# Patient Record
Sex: Female | Born: 1980
Health system: Southern US, Community
[De-identification: ages and names within clinical notes are randomized; demographics above are authoritative.]

## PROBLEM LIST (undated history)

## (undated) DIAGNOSIS — F070 Personality change due to known physiological condition: Secondary | ICD-10-CM

## (undated) DIAGNOSIS — S069XAA Unspecified intracranial injury with loss of consciousness status unknown, initial encounter: Secondary | ICD-10-CM

## (undated) DIAGNOSIS — S069X9A Unspecified intracranial injury with loss of consciousness of unspecified duration, initial encounter: Secondary | ICD-10-CM

## (undated) DIAGNOSIS — G8110 Spastic hemiplegia affecting unspecified side: Secondary | ICD-10-CM

## (undated) HISTORY — DX: Unspecified intracranial injury with loss of consciousness status unknown, initial encounter: S06.9XAA

## (undated) HISTORY — DX: Unspecified intracranial injury with loss of consciousness of unspecified duration, initial encounter: S06.9X9A

## (undated) HISTORY — DX: Personality change due to known physiological condition: F07.0

## (undated) HISTORY — DX: Spastic hemiplegia affecting unspecified side: G81.10

---
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
## 1999-12-25 ENCOUNTER — Emergency Department (HOSPITAL_COMMUNITY): Admission: EM | Admit: 1999-12-25 | Discharge: 1999-12-25 | Payer: Self-pay

## 2000-01-04 ENCOUNTER — Emergency Department (HOSPITAL_COMMUNITY): Admission: EM | Admit: 2000-01-04 | Discharge: 2000-01-04 | Payer: Self-pay | Admitting: Emergency Medicine

## 2000-08-12 ENCOUNTER — Other Ambulatory Visit: Admission: RE | Admit: 2000-08-12 | Discharge: 2000-08-12 | Payer: Self-pay | Admitting: Gynecology

## 2001-05-26 ENCOUNTER — Encounter: Payer: Self-pay | Admitting: Emergency Medicine

## 2001-05-26 ENCOUNTER — Emergency Department (HOSPITAL_COMMUNITY): Admission: EM | Admit: 2001-05-26 | Discharge: 2001-05-27 | Payer: Self-pay | Admitting: Physical Therapy

## 2006-02-21 ENCOUNTER — Other Ambulatory Visit: Admission: RE | Admit: 2006-02-21 | Discharge: 2006-02-21 | Payer: Self-pay | Admitting: Gynecology

## 2006-03-04 ENCOUNTER — Ambulatory Visit: Payer: Self-pay | Admitting: Internal Medicine

## 2008-01-23 ENCOUNTER — Inpatient Hospital Stay (HOSPITAL_COMMUNITY): Admission: AD | Admit: 2008-01-23 | Discharge: 2008-01-23 | Payer: Self-pay | Admitting: Obstetrics and Gynecology

## 2008-02-05 ENCOUNTER — Ambulatory Visit: Payer: Self-pay | Admitting: *Deleted

## 2008-02-05 ENCOUNTER — Inpatient Hospital Stay (HOSPITAL_COMMUNITY): Admission: AD | Admit: 2008-02-05 | Discharge: 2008-02-05 | Payer: Self-pay | Admitting: Obstetrics & Gynecology

## 2008-03-17 ENCOUNTER — Encounter: Admission: RE | Admit: 2008-03-17 | Discharge: 2008-03-17 | Payer: Self-pay | Admitting: Family Medicine

## 2008-05-09 ENCOUNTER — Inpatient Hospital Stay (HOSPITAL_COMMUNITY): Admission: AD | Admit: 2008-05-09 | Discharge: 2008-05-09 | Payer: Self-pay | Admitting: Gynecology

## 2008-05-09 ENCOUNTER — Ambulatory Visit: Payer: Self-pay | Admitting: Gynecology

## 2008-06-08 ENCOUNTER — Ambulatory Visit (HOSPITAL_COMMUNITY): Admission: RE | Admit: 2008-06-08 | Discharge: 2008-06-08 | Payer: Self-pay | Admitting: Obstetrics & Gynecology

## 2008-06-20 ENCOUNTER — Ambulatory Visit: Payer: Self-pay | Admitting: Obstetrics and Gynecology

## 2008-07-14 ENCOUNTER — Encounter: Admission: RE | Admit: 2008-07-14 | Discharge: 2008-07-14 | Payer: Self-pay | Admitting: Family Medicine

## 2009-02-23 ENCOUNTER — Encounter: Admission: RE | Admit: 2009-02-23 | Discharge: 2009-02-23 | Payer: Self-pay | Admitting: Family Medicine

## 2009-06-06 ENCOUNTER — Inpatient Hospital Stay (HOSPITAL_COMMUNITY): Admission: AD | Admit: 2009-06-06 | Discharge: 2009-06-06 | Payer: Self-pay | Admitting: Obstetrics & Gynecology

## 2009-11-05 HISTORY — PX: OTHER SURGICAL HISTORY: SHX169

## 2010-05-31 ENCOUNTER — Inpatient Hospital Stay (HOSPITAL_COMMUNITY): Admission: AD | Admit: 2010-05-31 | Discharge: 2010-05-31 | Payer: Self-pay | Admitting: Obstetrics & Gynecology

## 2010-05-31 ENCOUNTER — Ambulatory Visit: Payer: Self-pay | Admitting: Nurse Practitioner

## 2010-06-02 ENCOUNTER — Ambulatory Visit: Payer: Self-pay | Admitting: Obstetrics and Gynecology

## 2010-06-02 ENCOUNTER — Ambulatory Visit (HOSPITAL_COMMUNITY): Admission: RE | Admit: 2010-06-02 | Discharge: 2010-06-02 | Payer: Self-pay | Admitting: Obstetrics & Gynecology

## 2010-06-09 ENCOUNTER — Ambulatory Visit: Payer: Self-pay | Admitting: Family

## 2010-06-09 ENCOUNTER — Ambulatory Visit (HOSPITAL_COMMUNITY): Admission: RE | Admit: 2010-06-09 | Discharge: 2010-06-09 | Payer: Self-pay | Admitting: Obstetrics & Gynecology

## 2010-06-13 ENCOUNTER — Ambulatory Visit: Payer: Self-pay | Admitting: Obstetrics and Gynecology

## 2010-06-13 ENCOUNTER — Inpatient Hospital Stay (HOSPITAL_COMMUNITY): Admission: AD | Admit: 2010-06-13 | Discharge: 2010-06-13 | Payer: Self-pay | Admitting: Obstetrics and Gynecology

## 2010-06-15 ENCOUNTER — Ambulatory Visit (HOSPITAL_COMMUNITY): Admission: RE | Admit: 2010-06-15 | Discharge: 2010-06-15 | Payer: Self-pay | Admitting: Obstetrics and Gynecology

## 2010-06-15 ENCOUNTER — Ambulatory Visit: Payer: Self-pay | Admitting: Obstetrics and Gynecology

## 2010-06-22 ENCOUNTER — Ambulatory Visit: Payer: Self-pay | Admitting: Gynecology

## 2010-06-22 ENCOUNTER — Inpatient Hospital Stay (HOSPITAL_COMMUNITY)
Admission: AD | Admit: 2010-06-22 | Discharge: 2010-06-22 | Payer: Self-pay | Source: Home / Self Care | Admitting: Family Medicine

## 2010-10-27 ENCOUNTER — Inpatient Hospital Stay (HOSPITAL_COMMUNITY)
Admission: AC | Admit: 2010-10-27 | Discharge: 2010-12-05 | Disposition: A | Discharge status: Rehabilitation Facility | Payer: No Typology Code available for payment source | Source: Home / Self Care

## 2010-11-27 LAB — BASIC METABOLIC PANEL
BUN: 16 mg/dL (ref 6–23)
CO2: 28 mEq/L (ref 19–32)
CO2: 29 mEq/L (ref 19–32)
Calcium: 8.9 mg/dL (ref 8.4–10.5)
Chloride: 106 mEq/L (ref 96–112)
GFR calc Af Amer: >60 mL/min (ref >60)
Glucose, Bld: 126 mg/dL — ABNORMAL HIGH (ref 70–99)
Potassium: 4.1 mEq/L (ref 3.5–5.1)
Potassium: 3.9 mEq/L (ref 3.5–5.1)
Sodium: 141 mEq/L (ref 135–145)

## 2010-11-27 LAB — GLUCOSE, CAPILLARY
Glucose-Capillary: 124 mg/dL — ABNORMAL HIGH (ref 70–99)
Glucose-Capillary: 140 mg/dL — ABNORMAL HIGH (ref 70–99)
Glucose-Capillary: 140 mg/dL — ABNORMAL HIGH (ref 70–99)
Glucose-Capillary: 145 mg/dL — ABNORMAL HIGH (ref 70–99)
Glucose-Capillary: 161 mg/dL — ABNORMAL HIGH (ref 70–99)
Glucose-Capillary: 181 mg/dL — ABNORMAL HIGH (ref 70–99)
Glucose-Capillary: 105 mg/dL — ABNORMAL HIGH (ref 70–99)
Glucose-Capillary: 119 mg/dL — ABNORMAL HIGH (ref 70–99)
Glucose-Capillary: 119 mg/dL — ABNORMAL HIGH (ref 70–99)
Glucose-Capillary: 126 mg/dL — ABNORMAL HIGH (ref 70–99)
Glucose-Capillary: 151 mg/dL — ABNORMAL HIGH (ref 70–99)

## 2010-11-27 LAB — URINALYSIS, ROUTINE W REFLEX MICROSCOPIC
Nitrite: POSITIVE — AB
Specific Gravity, Urine: 1.029 (ref 1.005–1.030)
pH: 6 (ref 5.0–8.0)

## 2010-11-27 LAB — URINE MICROSCOPIC-ADD ON

## 2010-11-27 LAB — CBC
Hemoglobin: 10.8 g/dL — ABNORMAL LOW (ref 12.0–15.0)
MCV: 95.3 fL (ref 78.0–100.0)
Platelets: 485 10*3/uL — ABNORMAL HIGH (ref 150–400)
RBC: 3.8 MIL/uL — ABNORMAL LOW (ref 3.87–5.11)
WBC: 13.9 10*3/uL — ABNORMAL HIGH (ref 4.0–10.5)

## 2010-11-28 LAB — DIFFERENTIAL
Basophils Relative: 1 % (ref 0–1)
Lymphocytes Relative: 22 % (ref 12–46)
Lymphs Abs: 2.4 10*3/uL (ref 0.7–4.0)
Monocytes Relative: 9 % (ref 3–12)
Neutro Abs: 6.7 10*3/uL (ref 1.7–7.7)
Neutrophils Relative %: 64 % (ref 43–77)

## 2010-11-28 LAB — BASIC METABOLIC PANEL
CO2: 29 mEq/L (ref 19–32)
Calcium: 8.8 mg/dL (ref 8.4–10.5)
Creatinine, Ser: 0.46 mg/dL (ref 0.4–1.2)
GFR calc Af Amer: >60 mL/min (ref >60)
GFR calc non Af Amer: >60 mL/min (ref >60)
Sodium: 142 mEq/L (ref 135–145)

## 2010-11-28 LAB — GLUCOSE, CAPILLARY
Glucose-Capillary: 120 mg/dL — ABNORMAL HIGH (ref 70–99)
Glucose-Capillary: 120 mg/dL — ABNORMAL HIGH (ref 70–99)
Glucose-Capillary: 133 mg/dL — ABNORMAL HIGH (ref 70–99)
Glucose-Capillary: 139 mg/dL — ABNORMAL HIGH (ref 70–99)
Glucose-Capillary: 149 mg/dL — ABNORMAL HIGH (ref 70–99)
Glucose-Capillary: 128 mg/dL — ABNORMAL HIGH (ref 70–99)
Glucose-Capillary: 137 mg/dL — ABNORMAL HIGH (ref 70–99)
Glucose-Capillary: 143 mg/dL — ABNORMAL HIGH (ref 70–99)
Glucose-Capillary: 157 mg/dL — ABNORMAL HIGH (ref 70–99)
Glucose-Capillary: 91 mg/dL (ref 70–99)

## 2010-11-28 LAB — CBC
Hemoglobin: 10.7 g/dL — ABNORMAL LOW (ref 12.0–15.0)
MCH: 28.5 pg (ref 26.0–34.0)
MCV: 94.1 fL (ref 78.0–100.0)
RBC: 3.75 MIL/uL — ABNORMAL LOW (ref 3.87–5.11)

## 2010-11-29 ENCOUNTER — Encounter: Payer: Self-pay | Admitting: Family Medicine

## 2010-11-29 LAB — GLUCOSE, CAPILLARY
Glucose-Capillary: 123 mg/dL — ABNORMAL HIGH (ref 70–99)
Glucose-Capillary: 135 mg/dL — ABNORMAL HIGH (ref 70–99)
Glucose-Capillary: 143 mg/dL — ABNORMAL HIGH (ref 70–99)
Glucose-Capillary: 153 mg/dL — ABNORMAL HIGH (ref 70–99)
Glucose-Capillary: 118 mg/dL — ABNORMAL HIGH (ref 70–99)

## 2010-11-29 LAB — CATH TIP CULTURE

## 2010-11-30 LAB — GLUCOSE, CAPILLARY
Glucose-Capillary: 142 mg/dL — ABNORMAL HIGH (ref 70–99)
Glucose-Capillary: 133 mg/dL — ABNORMAL HIGH (ref 70–99)

## 2010-11-30 LAB — CULTURE, BLOOD (ROUTINE X 2): Culture  Setup Time: 201201202209

## 2010-12-01 LAB — GLUCOSE, CAPILLARY
Glucose-Capillary: 138 mg/dL — ABNORMAL HIGH (ref 70–99)
Glucose-Capillary: 118 mg/dL — ABNORMAL HIGH (ref 70–99)

## 2010-12-01 NOTE — Op Note (Signed)
[name], [name]               ACCOUNT NO.:  [id number]  MEDICAL RECORD NO.:  [id number]          PATIENT TYPE:  INP  LOCATION:  3108                         FACILITY:  [facility]  PHYSICIAN:  Cherylynn Ridges, M.D.    DATE OF BIRTH:  Jul 21, 1981  DATE OF PROCEDURE:  11/09/2010 DATE OF DISCHARGE:                              OPERATIVE REPORT   PREOPERATIVE DIAGNOSIS:  Severe closed head injury requiring prolonged ventilatory nutritional support.  POSTOP DIAGNOSIS:  Severe closed head injury requiring prolonged ventilatory nutritional support.  PROCEDURES: 1. Percutaneous endoscopic gastrostomy tube. 2. Percutaneous tracheostomy tube with bronchoscopy.  SURGEON:  Marta Lamas. Lindie Spruce, MD  ASSISTANT:  Amber L. Freida Busman, MD  ANESTHESIA:  She was given a total of 200 mcg of IV fentanyl, 8 mg of IV Versed, and 20 mg of IV Norcuron.  COMPLICATIONS:  None.  CONDITION:  Stable.  FINDINGS:  Normal anatomy.  ICP did rise up to about 40 during the procedure.  She was maintained in the semiupright position.  No less than 20 degrees for the procedures.  OPERATION:  The patient was done at the bedside in 3100 ICU.  After proper time-out was performed identifying the patient and the procedures to be performed, we started off with the percutaneous endoscopic gastrostomy tube.  Using a Pentax gastroscope, we were able to cannulate the oropharynx and there was some difficulty go through the oropharynx down into the upper esophagus and followed the orogastric tube into the stomach.  Once we entered the stomach, there was considerable amount of fluid which has to be aspirated.  We removed the orogastric tube and then subsequently insufflated the stomach with carbon dioxide gas with air.  We saw the impulse of the assistant's finger on the anterior abdominal wall and the left subcostal area.  A transverse incision was made using #11 blade and through that an Angiocath passed into the stomach under  direct vision.  It was through that catheter that a looped blue wire was passed in and brought out through the patient's mouth using a snare.  Once we pulled it out through the wire, we looped it around the pull-through gastrostomy tube and then put it out through the gastrotomy onto the anterior abdominal wall and confirmed its position with pictures of the tubing with a bolster being in place.  Once this was done, it was secured in place.  We then prepped the patient's neck for the tracheostomy.  We anesthetized transversely about a centimeter above the sternal notch and then made a transverse incision using a #15 blade.  We dissected down to the trachea and pretracheal fascia using blunt dissection with hemostat clamps and also sharp dissection with an electrocautery in order to identify the tracheal cartilages.  We then used an Angiocath attached to a syringe with saline as we went it through the trachea where the endotracheal tube was pulled back.  We cannulated the lumen of the trachea and then passed a long green wire into the distal trachea. A short stubby dilator and then a Blue Rhino serial dilator was passed over the wire into the trachea enlarging it  to its maximal size.  We used a 26-French dilator and introducer over the guide into the tracheotomy and passed #6 Shiley tracheostomy tube.  Once this was in place, we secured in place with a drain sponge underneath the phalanges and 4 corner stitches of 3-0 nylon.  We bronchoscoped the patient subsequently and confirmed the balloon and the tube to be in the distal trachea.  We also brought through the tracheostomy tube itself, found it to be easily well above the carina.  All counts were correct.  The patient remained stable in the ICU.     Cherylynn Ridges, M.D.     JOW/MEDQ  D:  11/09/2010  T:  11/09/2010  Job:  [id number]  Electronically Signed by Jimmye Norman M.D. on 11/29/2010 08:19:09 AM

## 2010-12-02 LAB — CBC
HCT: 37.1 % (ref 36.0–46.0)
Hemoglobin: 11.7 g/dL — ABNORMAL LOW (ref 12.0–15.0)
MCH: 28.9 pg (ref 26.0–34.0)
MCHC: 30.6 g/dL (ref 30.0–36.0)
MCHC: 31.5 g/dL (ref 30.0–36.0)
Platelets: 410 10*3/uL — ABNORMAL HIGH (ref 150–400)
RBC: 3.81 MIL/uL — ABNORMAL LOW (ref 3.87–5.11)
RBC: 4.07 MIL/uL (ref 3.87–5.11)

## 2010-12-02 LAB — URINALYSIS, ROUTINE W REFLEX MICROSCOPIC
Bilirubin Urine: NEGATIVE
Specific Gravity, Urine: 1.018 (ref 1.005–1.030)
pH: 7 (ref 5.0–8.0)

## 2010-12-02 LAB — GLUCOSE, CAPILLARY
Glucose-Capillary: 100 mg/dL — ABNORMAL HIGH (ref 70–99)
Glucose-Capillary: 124 mg/dL — ABNORMAL HIGH (ref 70–99)
Glucose-Capillary: 126 mg/dL — ABNORMAL HIGH (ref 70–99)
Glucose-Capillary: 140 mg/dL — ABNORMAL HIGH (ref 70–99)
Glucose-Capillary: 144 mg/dL — ABNORMAL HIGH (ref 70–99)
Glucose-Capillary: 145 mg/dL — ABNORMAL HIGH (ref 70–99)

## 2010-12-02 LAB — BASIC METABOLIC PANEL
CO2: 28 mEq/L (ref 19–32)
Calcium: 9.4 mg/dL (ref 8.4–10.5)
Chloride: 103 mEq/L (ref 96–112)
Glucose, Bld: 132 mg/dL — ABNORMAL HIGH (ref 70–99)
Potassium: 3.9 mEq/L (ref 3.5–5.1)
Sodium: 142 mEq/L (ref 135–145)

## 2010-12-02 LAB — URINE MICROSCOPIC-ADD ON

## 2010-12-03 LAB — GLUCOSE, CAPILLARY
Glucose-Capillary: 125 mg/dL — ABNORMAL HIGH (ref 70–99)
Glucose-Capillary: 126 mg/dL — ABNORMAL HIGH (ref 70–99)
Glucose-Capillary: 129 mg/dL — ABNORMAL HIGH (ref 70–99)

## 2010-12-04 LAB — GLUCOSE, CAPILLARY
Glucose-Capillary: 97 mg/dL (ref 70–99)
Glucose-Capillary: 130 mg/dL — ABNORMAL HIGH (ref 70–99)

## 2010-12-05 ENCOUNTER — Inpatient Hospital Stay (HOSPITAL_COMMUNITY)
Admission: RE | Admit: 2010-12-05 | Discharge: 2011-01-19 | DRG: 945 | Disposition: A | Discharge status: Home / Self Care | Payer: Medicaid Other | Source: Other Acute Inpatient Hospital | Attending: Physical Medicine & Rehabilitation | Admitting: Physical Medicine & Rehabilitation

## 2010-12-05 DIAGNOSIS — S0280XA Fracture of other specified skull and facial bones, unspecified side, initial encounter for closed fracture: Secondary | ICD-10-CM | POA: Diagnosis present

## 2010-12-05 DIAGNOSIS — Z5189 Encounter for other specified aftercare: Principal | ICD-10-CM

## 2010-12-05 DIAGNOSIS — G8190 Hemiplegia, unspecified affecting unspecified side: Secondary | ICD-10-CM | POA: Diagnosis present

## 2010-12-05 DIAGNOSIS — Z931 Gastrostomy status: Secondary | ICD-10-CM

## 2010-12-05 DIAGNOSIS — Z930 Tracheostomy status: Secondary | ICD-10-CM

## 2010-12-05 LAB — GLUCOSE, CAPILLARY: Glucose-Capillary: 121 mg/dL — ABNORMAL HIGH (ref 70–99)

## 2010-12-06 DIAGNOSIS — J9600 Acute respiratory failure, unspecified whether with hypoxia or hypercapnia: Secondary | ICD-10-CM

## 2010-12-06 DIAGNOSIS — S069X9A Unspecified intracranial injury with loss of consciousness of unspecified duration, initial encounter: Secondary | ICD-10-CM

## 2010-12-06 DIAGNOSIS — R1310 Dysphagia, unspecified: Secondary | ICD-10-CM

## 2010-12-06 LAB — URINE MICROSCOPIC-ADD ON

## 2010-12-06 LAB — CBC
HCT: 38.7 % (ref 36.0–46.0)
Hemoglobin: 11.9 g/dL — ABNORMAL LOW (ref 12.0–15.0)
MCV: 92.1 fL (ref 78.0–100.0)
RBC: 4.2 MIL/uL (ref 3.87–5.11)
WBC: 7.4 10*3/uL (ref 4.0–10.5)

## 2010-12-06 LAB — URINALYSIS, ROUTINE W REFLEX MICROSCOPIC
Bilirubin Urine: NEGATIVE
Nitrite: POSITIVE — AB
Protein, ur: NEGATIVE mg/dL
Urobilinogen, UA: 0.2 mg/dL (ref 0.0–1.0)

## 2010-12-06 LAB — COMPREHENSIVE METABOLIC PANEL
Albumin: 3.2 g/dL — ABNORMAL LOW (ref 3.5–5.2)
Alkaline Phosphatase: 88 U/L (ref 39–117)
BUN: 17 mg/dL (ref 6–23)
CO2: 27 mEq/L (ref 19–32)
Chloride: 104 mEq/L (ref 96–112)
GFR calc non Af Amer: >60 mL/min (ref >60)
Glucose, Bld: 112 mg/dL — ABNORMAL HIGH (ref 70–99)
Potassium: 4.1 mEq/L (ref 3.5–5.1)
Total Bilirubin: 0.4 mg/dL (ref 0.3–1.2)

## 2010-12-06 LAB — GLUCOSE, CAPILLARY
Glucose-Capillary: 113 mg/dL — ABNORMAL HIGH (ref 70–99)
Glucose-Capillary: 108 mg/dL — ABNORMAL HIGH (ref 70–99)

## 2010-12-06 LAB — DIFFERENTIAL
Lymphocytes Relative: 19 % (ref 12–46)
Lymphs Abs: 1.4 10*3/uL (ref 0.7–4.0)
Monocytes Relative: 9 % (ref 3–12)
Neutrophils Relative %: 68 % (ref 43–77)

## 2010-12-08 ENCOUNTER — Inpatient Hospital Stay (HOSPITAL_COMMUNITY): Payer: Medicaid Other

## 2010-12-08 LAB — URINE CULTURE
Colony Count: >=100000
Culture  Setup Time: 201202011250

## 2010-12-08 LAB — GLUCOSE, CAPILLARY: Glucose-Capillary: 99 mg/dL (ref 70–99)

## 2010-12-09 DIAGNOSIS — S069X9A Unspecified intracranial injury with loss of consciousness of unspecified duration, initial encounter: Secondary | ICD-10-CM

## 2010-12-09 DIAGNOSIS — J9600 Acute respiratory failure, unspecified whether with hypoxia or hypercapnia: Secondary | ICD-10-CM

## 2010-12-09 DIAGNOSIS — R1310 Dysphagia, unspecified: Secondary | ICD-10-CM

## 2010-12-14 LAB — BASIC METABOLIC PANEL
CO2: 30 mEq/L (ref 19–32)
GFR calc non Af Amer: >60 mL/min (ref >60)
Glucose, Bld: 107 mg/dL — ABNORMAL HIGH (ref 70–99)
Potassium: 4.6 mEq/L (ref 3.5–5.1)
Sodium: 140 mEq/L (ref 135–145)

## 2010-12-14 LAB — PREALBUMIN: Prealbumin: 31.9 mg/dL (ref 17.0–34.0)

## 2010-12-14 LAB — CBC
HCT: 38.9 % (ref 36.0–46.0)
Hemoglobin: 12.3 g/dL (ref 12.0–15.0)
RDW: 13.8 % (ref 11.5–15.5)
WBC: 6.6 10*3/uL (ref 4.0–10.5)

## 2010-12-16 LAB — URINALYSIS, ROUTINE W REFLEX MICROSCOPIC
Protein, ur: NEGATIVE mg/dL
Urobilinogen, UA: 0.2 mg/dL (ref 0.0–1.0)

## 2010-12-19 LAB — URINE CULTURE: Colony Count: >=100000
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
## 2010-12-20 DIAGNOSIS — R1310 Dysphagia, unspecified: Secondary | ICD-10-CM

## 2010-12-20 DIAGNOSIS — J9600 Acute respiratory failure, unspecified whether with hypoxia or hypercapnia: Secondary | ICD-10-CM

## 2010-12-20 DIAGNOSIS — S069X9A Unspecified intracranial injury with loss of consciousness of unspecified duration, initial encounter: Secondary | ICD-10-CM

## 2010-12-22 LAB — BASIC METABOLIC PANEL
BUN: 7 mg/dL (ref 6–23)
Calcium: 9.2 mg/dL (ref 8.4–10.5)
Creatinine, Ser: 0.49 mg/dL (ref 0.4–1.2)
GFR calc non Af Amer: >60 mL/min (ref >60)
Glucose, Bld: 114 mg/dL — ABNORMAL HIGH (ref 70–99)
Sodium: 141 mEq/L (ref 135–145)

## 2010-12-22 LAB — CBC
HCT: 37.7 % (ref 36.0–46.0)
MCH: 29.7 pg (ref 26.0–34.0)
MCHC: 32.9 g/dL (ref 30.0–36.0)
MCV: 90.4 fL (ref 78.0–100.0)
RDW: 13.7 % (ref 11.5–15.5)

## 2010-12-28 LAB — PREALBUMIN: Prealbumin: 29 mg/dL (ref 17.0–34.0)

## 2010-12-28 LAB — BASIC METABOLIC PANEL
BUN: 6 mg/dL (ref 6–23)
Calcium: 9.6 mg/dL (ref 8.4–10.5)
Creatinine, Ser: 0.54 mg/dL (ref 0.4–1.2)
GFR calc non Af Amer: >60 mL/min (ref >60)
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
## 2011-01-01 DIAGNOSIS — J9600 Acute respiratory failure, unspecified whether with hypoxia or hypercapnia: Secondary | ICD-10-CM

## 2011-01-01 DIAGNOSIS — R1310 Dysphagia, unspecified: Secondary | ICD-10-CM

## 2011-01-01 DIAGNOSIS — S069X9A Unspecified intracranial injury with loss of consciousness of unspecified duration, initial encounter: Secondary | ICD-10-CM

## 2011-01-01 LAB — COMPREHENSIVE METABOLIC PANEL
Albumin: 3.5 g/dL (ref 3.5–5.2)
BUN: 4 mg/dL — ABNORMAL LOW (ref 6–23)
Creatinine, Ser: 0.58 mg/dL (ref 0.4–1.2)
Glucose, Bld: 95 mg/dL (ref 70–99)
Total Protein: 6.4 g/dL (ref 6.0–8.3)

## 2011-01-02 DIAGNOSIS — R1310 Dysphagia, unspecified: Secondary | ICD-10-CM

## 2011-01-02 DIAGNOSIS — S069X9A Unspecified intracranial injury with loss of consciousness of unspecified duration, initial encounter: Secondary | ICD-10-CM

## 2011-01-02 DIAGNOSIS — J9600 Acute respiratory failure, unspecified whether with hypoxia or hypercapnia: Secondary | ICD-10-CM

## 2011-01-03 DIAGNOSIS — J9600 Acute respiratory failure, unspecified whether with hypoxia or hypercapnia: Secondary | ICD-10-CM

## 2011-01-03 DIAGNOSIS — S069X9A Unspecified intracranial injury with loss of consciousness of unspecified duration, initial encounter: Secondary | ICD-10-CM

## 2011-01-03 DIAGNOSIS — R1310 Dysphagia, unspecified: Secondary | ICD-10-CM

## 2011-01-04 DIAGNOSIS — S069X9A Unspecified intracranial injury with loss of consciousness of unspecified duration, initial encounter: Secondary | ICD-10-CM

## 2011-01-04 DIAGNOSIS — J9600 Acute respiratory failure, unspecified whether with hypoxia or hypercapnia: Secondary | ICD-10-CM

## 2011-01-04 DIAGNOSIS — R1310 Dysphagia, unspecified: Secondary | ICD-10-CM

## 2011-01-04 DEATH — deceased

## 2011-01-08 LAB — URINALYSIS, ROUTINE W REFLEX MICROSCOPIC
Glucose, UA: NEGATIVE mg/dL
Hgb urine dipstick: NEGATIVE
Specific Gravity, Urine: 1.017 (ref 1.005–1.030)
Urobilinogen, UA: 0.2 mg/dL (ref 0.0–1.0)

## 2011-01-08 LAB — URINE MICROSCOPIC-ADD ON

## 2011-01-10 LAB — URINE CULTURE: Culture  Setup Time: 201203052016

## 2011-01-12 DIAGNOSIS — R1310 Dysphagia, unspecified: Secondary | ICD-10-CM

## 2011-01-12 DIAGNOSIS — J9600 Acute respiratory failure, unspecified whether with hypoxia or hypercapnia: Secondary | ICD-10-CM

## 2011-01-12 DIAGNOSIS — S069X9A Unspecified intracranial injury with loss of consciousness of unspecified duration, initial encounter: Secondary | ICD-10-CM

## 2011-01-12 LAB — URINALYSIS, MICROSCOPIC ONLY
Bilirubin Urine: NEGATIVE
Ketones, ur: NEGATIVE mg/dL
Nitrite: NEGATIVE
Urobilinogen, UA: 0.2 mg/dL (ref 0.0–1.0)
pH: 7.5 (ref 5.0–8.0)

## 2011-01-13 DIAGNOSIS — S069X9A Unspecified intracranial injury with loss of consciousness of unspecified duration, initial encounter: Secondary | ICD-10-CM

## 2011-01-13 DIAGNOSIS — R1310 Dysphagia, unspecified: Secondary | ICD-10-CM

## 2011-01-13 DIAGNOSIS — J9600 Acute respiratory failure, unspecified whether with hypoxia or hypercapnia: Secondary | ICD-10-CM

## 2011-01-13 LAB — URINALYSIS, ROUTINE W REFLEX MICROSCOPIC
Bilirubin Urine: NEGATIVE
Glucose, UA: NEGATIVE mg/dL
Hgb urine dipstick: NEGATIVE
Specific Gravity, Urine: 1.016 (ref 1.005–1.030)
pH: 7 (ref 5.0–8.0)

## 2011-01-13 LAB — URINE MICROSCOPIC-ADD ON

## 2011-01-14 LAB — URINE CULTURE
Colony Count: 7000
Culture  Setup Time: 201203091914
Culture  Setup Time: 201203101440

## 2011-01-15 LAB — GLUCOSE, CAPILLARY
Glucose-Capillary: 109 mg/dL — ABNORMAL HIGH (ref 70–99)
Glucose-Capillary: 116 mg/dL — ABNORMAL HIGH (ref 70–99)
Glucose-Capillary: 118 mg/dL — ABNORMAL HIGH (ref 70–99)
Glucose-Capillary: 122 mg/dL — ABNORMAL HIGH (ref 70–99)
Glucose-Capillary: 126 mg/dL — ABNORMAL HIGH (ref 70–99)
Glucose-Capillary: 126 mg/dL — ABNORMAL HIGH (ref 70–99)
Glucose-Capillary: 130 mg/dL — ABNORMAL HIGH (ref 70–99)
Glucose-Capillary: 144 mg/dL — ABNORMAL HIGH (ref 70–99)
Glucose-Capillary: 150 mg/dL — ABNORMAL HIGH (ref 70–99)
Glucose-Capillary: 150 mg/dL — ABNORMAL HIGH (ref 70–99)
Glucose-Capillary: 152 mg/dL — ABNORMAL HIGH (ref 70–99)
Glucose-Capillary: 154 mg/dL — ABNORMAL HIGH (ref 70–99)
Glucose-Capillary: 154 mg/dL — ABNORMAL HIGH (ref 70–99)
Glucose-Capillary: 165 mg/dL — ABNORMAL HIGH (ref 70–99)
Glucose-Capillary: 181 mg/dL — ABNORMAL HIGH (ref 70–99)
Glucose-Capillary: 187 mg/dL — ABNORMAL HIGH (ref 70–99)
Glucose-Capillary: 187 mg/dL — ABNORMAL HIGH (ref 70–99)
Glucose-Capillary: 187 mg/dL — ABNORMAL HIGH (ref 70–99)
Glucose-Capillary: 207 mg/dL — ABNORMAL HIGH (ref 70–99)
Glucose-Capillary: 215 mg/dL — ABNORMAL HIGH (ref 70–99)
Glucose-Capillary: 220 mg/dL — ABNORMAL HIGH (ref 70–99)

## 2011-01-15 LAB — POCT I-STAT, CHEM 8
Creatinine, Ser: 0.9 mg/dL (ref 0.4–1.2)
Glucose, Bld: 182 mg/dL — ABNORMAL HIGH (ref 70–99)
HCT: 43 % (ref 36.0–46.0)
Hemoglobin: 14.6 g/dL (ref 12.0–15.0)
Potassium: 3.8 mEq/L (ref 3.5–5.1)
TCO2: 20 mmol/L (ref 0–100)

## 2011-01-15 LAB — BLOOD GAS, ARTERIAL
Acid-Base Excess: 0 mmol/L (ref 0.0–2.0)
Acid-Base Excess: 0.6 mmol/L (ref 0.0–2.0)
Acid-Base Excess: 1.1 mmol/L (ref 0.0–2.0)
Acid-Base Excess: 2.9 mmol/L — ABNORMAL HIGH (ref 0.0–2.0)
Acid-Base Excess: 3.5 mmol/L — ABNORMAL HIGH (ref 0.0–2.0)
Acid-Base Excess: 3.8 mmol/L — ABNORMAL HIGH (ref 0.0–2.0)
Acid-base deficit: 0.1 mmol/L (ref 0.0–2.0)
Acid-base deficit: 1.3 mmol/L (ref 0.0–2.0)
Acid-base deficit: 1.9 mmol/L (ref 0.0–2.0)
Bicarbonate: 21.7 mEq/L (ref 20.0–24.0)
Bicarbonate: 22.2 mEq/L (ref 20.0–24.0)
Bicarbonate: 23.3 mEq/L (ref 20.0–24.0)
Bicarbonate: 23.9 mEq/L (ref 20.0–24.0)
Bicarbonate: 24.5 mEq/L — ABNORMAL HIGH (ref 20.0–24.0)
Bicarbonate: 24.8 mEq/L — ABNORMAL HIGH (ref 20.0–24.0)
Bicarbonate: 26.5 mEq/L — ABNORMAL HIGH (ref 20.0–24.0)
Bicarbonate: 26.6 mEq/L — ABNORMAL HIGH (ref 20.0–24.0)
Bicarbonate: 27.2 mEq/L — ABNORMAL HIGH (ref 20.0–24.0)
Bicarbonate: 27.8 mEq/L — ABNORMAL HIGH (ref 20.0–24.0)
Bicarbonate: 27.9 mEq/L — ABNORMAL HIGH (ref 20.0–24.0)
Bicarbonate: 28.6 mEq/L — ABNORMAL HIGH (ref 20.0–24.0)
Drawn by: 13898
Drawn by: 249101
Drawn by: 290171
Drawn by: 29925
Drawn by: 312881
FIO2: 0.3 %
FIO2: 0.3 %
FIO2: 0.3 %
FIO2: 0.3 %
FIO2: 0.3 %
FIO2: 0.3 %
FIO2: 0.3 %
FIO2: 0.3 %
FIO2: 0.3 %
MECHVT: 500 mL
MECHVT: 500 mL
MECHVT: 500 mL
MECHVT: 500 mL
MECHVT: 500 mL
MECHVT: 500 mL
MECHVT: 500 mL
MECHVT: 500 mL
O2 Saturation: 95.2 %
O2 Saturation: 98.3 %
O2 Saturation: 98.6 %
O2 Saturation: 98.6 %
O2 Saturation: 99.3 %
O2 Saturation: 99.8 %
PEEP: 5 cmH2O
PEEP: 5 cmH2O
PEEP: 5 cmH2O
PEEP: 5 cmH2O
PEEP: 5 cmH2O
PEEP: 5 cmH2O
PEEP: 5 cmH2O
PEEP: 5 cmH2O
Patient temperature: 98.6
Patient temperature: 98.6
Patient temperature: 98.6
Patient temperature: 98.6
Patient temperature: 98.6
Patient temperature: 98.6
Patient temperature: 98.6
Patient temperature: 98.6
Patient temperature: 98.6
Patient temperature: 98.8
RATE: 12 resp/min
RATE: 12 resp/min
RATE: 14 resp/min
RATE: 15 resp/min
RATE: 18 resp/min
RATE: 18 resp/min
TCO2: 22.6 mmol/L (ref 0–100)
TCO2: 22.9 mmol/L (ref 0–100)
TCO2: 23.2 mmol/L (ref 0–100)
TCO2: 24.2 mmol/L (ref 0–100)
TCO2: 24.3 mmol/L (ref 0–100)
TCO2: 25.5 mmol/L (ref 0–100)
TCO2: 27.7 mmol/L (ref 0–100)
TCO2: 28.4 mmol/L (ref 0–100)
TCO2: 29.2 mmol/L (ref 0–100)
pCO2 arterial: 32.1 mmHg — ABNORMAL LOW (ref 35.0–45.0)
pCO2 arterial: 38.1 mmHg (ref 35.0–45.0)
pCO2 arterial: 38.9 mmHg (ref 35.0–45.0)
pCO2 arterial: 40.1 mmHg (ref 35.0–45.0)
pCO2 arterial: 43.8 mmHg (ref 35.0–45.0)
pCO2 arterial: 45.6 mmHg — ABNORMAL HIGH (ref 35.0–45.0)
pH, Arterial: 7.423 — ABNORMAL HIGH (ref 7.350–7.400)
pH, Arterial: 7.426 — ABNORMAL HIGH (ref 7.350–7.400)
pH, Arterial: 7.455 — ABNORMAL HIGH (ref 7.350–7.400)
pH, Arterial: 7.457 — ABNORMAL HIGH (ref 7.350–7.400)
pH, Arterial: 7.459 — ABNORMAL HIGH (ref 7.350–7.400)
pH, Arterial: 7.463 — ABNORMAL HIGH (ref 7.350–7.400)
pH, Arterial: 7.469 — ABNORMAL HIGH (ref 7.350–7.400)
pH, Arterial: 7.474 — ABNORMAL HIGH (ref 7.350–7.400)
pH, Arterial: 7.481 — ABNORMAL HIGH (ref 7.350–7.400)
pH, Arterial: 7.503 — ABNORMAL HIGH (ref 7.350–7.400)
pH, Arterial: 7.507 — ABNORMAL HIGH (ref 7.350–7.400)
pO2, Arterial: 109 mmHg — ABNORMAL HIGH (ref 80.0–100.0)
pO2, Arterial: 111 mmHg — ABNORMAL HIGH (ref 80.0–100.0)
pO2, Arterial: 112 mmHg — ABNORMAL HIGH (ref 80.0–100.0)
pO2, Arterial: 115 mmHg — ABNORMAL HIGH (ref 80.0–100.0)
pO2, Arterial: 122 mmHg — ABNORMAL HIGH (ref 80.0–100.0)
pO2, Arterial: 129 mmHg — ABNORMAL HIGH (ref 80.0–100.0)
pO2, Arterial: 280 mmHg — ABNORMAL HIGH (ref 80.0–100.0)
pO2, Arterial: 73.5 mmHg — ABNORMAL LOW (ref 80.0–100.0)
pO2, Arterial: 92.5 mmHg (ref 80.0–100.0)

## 2011-01-15 LAB — CBC
HCT: 26.4 % — ABNORMAL LOW (ref 36.0–46.0)
HCT: 26.4 % — ABNORMAL LOW (ref 36.0–46.0)
HCT: 27 % — ABNORMAL LOW (ref 36.0–46.0)
HCT: 28.3 % — ABNORMAL LOW (ref 36.0–46.0)
HCT: 31.6 % — ABNORMAL LOW (ref 36.0–46.0)
HCT: 33 % — ABNORMAL LOW (ref 36.0–46.0)
HCT: 41.2 % (ref 36.0–46.0)
Hemoglobin: 14 g/dL (ref 12.0–15.0)
Hemoglobin: 8.6 g/dL — ABNORMAL LOW (ref 12.0–15.0)
Hemoglobin: 8.6 g/dL — ABNORMAL LOW (ref 12.0–15.0)
Hemoglobin: 8.7 g/dL — ABNORMAL LOW (ref 12.0–15.0)
Hemoglobin: 9 g/dL — ABNORMAL LOW (ref 12.0–15.0)
Hemoglobin: 9.6 g/dL — ABNORMAL LOW (ref 12.0–15.0)
Hemoglobin: 9.8 g/dL — ABNORMAL LOW (ref 12.0–15.0)
MCH: 29.8 pg (ref 26.0–34.0)
MCH: 30 pg (ref 26.0–34.0)
MCH: 30.1 pg (ref 26.0–34.0)
MCH: 30.5 pg (ref 26.0–34.0)
MCH: 30.8 pg (ref 26.0–34.0)
MCH: 30.8 pg (ref 26.0–34.0)
MCH: 30.9 pg (ref 26.0–34.0)
MCH: 31.3 pg (ref 26.0–34.0)
MCHC: 31.8 g/dL (ref 30.0–36.0)
MCHC: 31.9 g/dL (ref 30.0–36.0)
MCHC: 32.3 g/dL (ref 30.0–36.0)
MCHC: 33 g/dL (ref 30.0–36.0)
MCHC: 33.6 g/dL (ref 30.0–36.0)
MCHC: 34 g/dL (ref 30.0–36.0)
MCHC: 34.6 g/dL (ref 30.0–36.0)
MCV: 90.3 fL (ref 78.0–100.0)
MCV: 90.4 fL (ref 78.0–100.0)
MCV: 91.3 fL (ref 78.0–100.0)
MCV: 91.3 fL (ref 78.0–100.0)
MCV: 91.6 fL (ref 78.0–100.0)
MCV: 91.9 fL (ref 78.0–100.0)
MCV: 92.2 fL (ref 78.0–100.0)
MCV: 92.6 fL (ref 78.0–100.0)
MCV: 92.9 fL (ref 78.0–100.0)
MCV: 93.1 fL (ref 78.0–100.0)
MCV: 93.4 fL (ref 78.0–100.0)
Platelets: 209 10*3/uL (ref 150–400)
Platelets: 216 10*3/uL (ref 150–400)
Platelets: 225 10*3/uL (ref 150–400)
Platelets: 297 10*3/uL (ref 150–400)
Platelets: 377 10*3/uL (ref 150–400)
Platelets: 392 10*3/uL (ref 150–400)
Platelets: ADEQUATE 10*3/uL (ref 150–400)
RBC: 2.73 MIL/uL — ABNORMAL LOW (ref 3.87–5.11)
RBC: 2.85 MIL/uL — ABNORMAL LOW (ref 3.87–5.11)
RBC: 2.86 MIL/uL — ABNORMAL LOW (ref 3.87–5.11)
RBC: 2.89 MIL/uL — ABNORMAL LOW (ref 3.87–5.11)
RBC: 3.15 MIL/uL — ABNORMAL LOW (ref 3.87–5.11)
RBC: 3.17 MIL/uL — ABNORMAL LOW (ref 3.87–5.11)
RDW: 13.2 % (ref 11.5–15.5)
RDW: 13.2 % (ref 11.5–15.5)
RDW: 13.3 % (ref 11.5–15.5)
RDW: 13.5 % (ref 11.5–15.5)
RDW: 13.5 % (ref 11.5–15.5)
RDW: 13.7 % (ref 11.5–15.5)
RDW: 13.8 % (ref 11.5–15.5)
RDW: 14 % (ref 11.5–15.5)
RDW: 14.3 % (ref 11.5–15.5)
RDW: 14.3 % (ref 11.5–15.5)
WBC: 12.3 10*3/uL — ABNORMAL HIGH (ref 4.0–10.5)
WBC: 12.4 10*3/uL — ABNORMAL HIGH (ref 4.0–10.5)
WBC: 12.9 10*3/uL — ABNORMAL HIGH (ref 4.0–10.5)
WBC: 13.5 10*3/uL — ABNORMAL HIGH (ref 4.0–10.5)
WBC: 17.1 10*3/uL — ABNORMAL HIGH (ref 4.0–10.5)
WBC: 17.4 10*3/uL — ABNORMAL HIGH (ref 4.0–10.5)
WBC: 17.9 10*3/uL — ABNORMAL HIGH (ref 4.0–10.5)
WBC: 21.1 10*3/uL — ABNORMAL HIGH (ref 4.0–10.5)

## 2011-01-15 LAB — BASIC METABOLIC PANEL
BUN: 12 mg/dL (ref 6–23)
BUN: 14 mg/dL (ref 6–23)
BUN: 14 mg/dL (ref 6–23)
BUN: 15 mg/dL (ref 6–23)
BUN: 16 mg/dL (ref 6–23)
BUN: 17 mg/dL (ref 6–23)
BUN: 19 mg/dL (ref 6–23)
BUN: 3 mg/dL — ABNORMAL LOW (ref 6–23)
BUN: 4 mg/dL — ABNORMAL LOW (ref 6–23)
BUN: 8 mg/dL (ref 6–23)
BUN: 8 mg/dL (ref 6–23)
BUN: 9 mg/dL (ref 6–23)
CO2: 22 mEq/L (ref 19–32)
CO2: 22 mEq/L (ref 19–32)
CO2: 23 mEq/L (ref 19–32)
CO2: 23 mEq/L (ref 19–32)
CO2: 24 mEq/L (ref 19–32)
CO2: 24 mEq/L (ref 19–32)
CO2: 24 mEq/L (ref 19–32)
CO2: 25 mEq/L (ref 19–32)
CO2: 26 mEq/L (ref 19–32)
CO2: 26 mEq/L (ref 19–32)
CO2: 28 mEq/L (ref 19–32)
CO2: 29 mEq/L (ref 19–32)
Calcium: 7.9 mg/dL — ABNORMAL LOW (ref 8.4–10.5)
Calcium: 8.3 mg/dL — ABNORMAL LOW (ref 8.4–10.5)
Calcium: 8.3 mg/dL — ABNORMAL LOW (ref 8.4–10.5)
Calcium: 8.4 mg/dL (ref 8.4–10.5)
Calcium: 8.4 mg/dL (ref 8.4–10.5)
Calcium: 8.6 mg/dL (ref 8.4–10.5)
Calcium: 8.7 mg/dL (ref 8.4–10.5)
Chloride: 103 mEq/L (ref 96–112)
Chloride: 106 mEq/L (ref 96–112)
Chloride: 109 mEq/L (ref 96–112)
Chloride: 110 mEq/L (ref 96–112)
Chloride: 111 mEq/L (ref 96–112)
Chloride: 111 mEq/L (ref 96–112)
Chloride: 112 mEq/L (ref 96–112)
Chloride: 112 mEq/L (ref 96–112)
Chloride: 113 mEq/L — ABNORMAL HIGH (ref 96–112)
Chloride: 113 mEq/L — ABNORMAL HIGH (ref 96–112)
Chloride: 114 mEq/L — ABNORMAL HIGH (ref 96–112)
Chloride: 114 mEq/L — ABNORMAL HIGH (ref 96–112)
Chloride: 116 mEq/L — ABNORMAL HIGH (ref 96–112)
Chloride: 116 mEq/L — ABNORMAL HIGH (ref 96–112)
Chloride: 116 mEq/L — ABNORMAL HIGH (ref 96–112)
Chloride: 117 mEq/L — ABNORMAL HIGH (ref 96–112)
Chloride: 117 mEq/L — ABNORMAL HIGH (ref 96–112)
Creatinine, Ser: 0.42 mg/dL (ref 0.4–1.2)
Creatinine, Ser: 0.45 mg/dL (ref 0.4–1.2)
Creatinine, Ser: 0.51 mg/dL (ref 0.4–1.2)
Creatinine, Ser: 0.51 mg/dL (ref 0.4–1.2)
Creatinine, Ser: 0.52 mg/dL (ref 0.4–1.2)
Creatinine, Ser: 0.52 mg/dL (ref 0.4–1.2)
Creatinine, Ser: 0.52 mg/dL (ref 0.4–1.2)
Creatinine, Ser: 0.52 mg/dL (ref 0.4–1.2)
Creatinine, Ser: 0.56 mg/dL (ref 0.4–1.2)
Creatinine, Ser: 0.58 mg/dL (ref 0.4–1.2)
Creatinine, Ser: 0.6 mg/dL (ref 0.4–1.2)
Creatinine, Ser: 0.61 mg/dL (ref 0.4–1.2)
Creatinine, Ser: 0.63 mg/dL (ref 0.4–1.2)
Creatinine, Ser: 0.64 mg/dL (ref 0.4–1.2)
GFR calc Af Amer: >60 mL/min (ref >60)
GFR calc Af Amer: >60 mL/min (ref >60)
GFR calc Af Amer: >60 mL/min (ref >60)
GFR calc Af Amer: >60 mL/min (ref >60)
GFR calc Af Amer: >60 mL/min (ref >60)
GFR calc Af Amer: >60 mL/min (ref >60)
GFR calc Af Amer: >60 mL/min (ref >60)
GFR calc Af Amer: >60 mL/min (ref >60)
GFR calc Af Amer: >60 mL/min (ref >60)
GFR calc Af Amer: >60 mL/min (ref >60)
GFR calc Af Amer: >60 mL/min (ref >60)
GFR calc non Af Amer: >60 mL/min (ref >60)
GFR calc non Af Amer: >60 mL/min (ref >60)
GFR calc non Af Amer: >60 mL/min (ref >60)
GFR calc non Af Amer: >60 mL/min (ref >60)
GFR calc non Af Amer: >60 mL/min (ref >60)
GFR calc non Af Amer: >60 mL/min (ref >60)
GFR calc non Af Amer: >60 mL/min (ref >60)
GFR calc non Af Amer: >60 mL/min (ref >60)
GFR calc non Af Amer: >60 mL/min (ref >60)
GFR calc non Af Amer: >60 mL/min (ref >60)
GFR calc non Af Amer: >60 mL/min (ref >60)
Glucose, Bld: 116 mg/dL — ABNORMAL HIGH (ref 70–99)
Glucose, Bld: 137 mg/dL — ABNORMAL HIGH (ref 70–99)
Glucose, Bld: 157 mg/dL — ABNORMAL HIGH (ref 70–99)
Glucose, Bld: 159 mg/dL — ABNORMAL HIGH (ref 70–99)
Glucose, Bld: 163 mg/dL — ABNORMAL HIGH (ref 70–99)
Glucose, Bld: 164 mg/dL — ABNORMAL HIGH (ref 70–99)
Glucose, Bld: 168 mg/dL — ABNORMAL HIGH (ref 70–99)
Glucose, Bld: 172 mg/dL — ABNORMAL HIGH (ref 70–99)
Glucose, Bld: 191 mg/dL — ABNORMAL HIGH (ref 70–99)
Potassium: 3.2 mEq/L — ABNORMAL LOW (ref 3.5–5.1)
Potassium: 3.5 mEq/L (ref 3.5–5.1)
Potassium: 3.6 mEq/L (ref 3.5–5.1)
Potassium: 3.6 mEq/L (ref 3.5–5.1)
Potassium: 3.7 mEq/L (ref 3.5–5.1)
Potassium: 3.7 mEq/L (ref 3.5–5.1)
Potassium: 3.7 mEq/L (ref 3.5–5.1)
Potassium: 3.7 mEq/L (ref 3.5–5.1)
Potassium: 3.8 mEq/L (ref 3.5–5.1)
Potassium: 3.9 mEq/L (ref 3.5–5.1)
Potassium: 3.9 mEq/L (ref 3.5–5.1)
Potassium: 3.9 mEq/L (ref 3.5–5.1)
Potassium: 4.2 mEq/L (ref 3.5–5.1)
Sodium: 138 mEq/L (ref 135–145)
Sodium: 142 mEq/L (ref 135–145)
Sodium: 143 mEq/L (ref 135–145)
Sodium: 143 mEq/L (ref 135–145)
Sodium: 143 mEq/L (ref 135–145)
Sodium: 144 mEq/L (ref 135–145)
Sodium: 145 mEq/L (ref 135–145)
Sodium: 146 mEq/L — ABNORMAL HIGH (ref 135–145)
Sodium: 147 mEq/L — ABNORMAL HIGH (ref 135–145)
Sodium: 147 mEq/L — ABNORMAL HIGH (ref 135–145)
Sodium: 147 mEq/L — ABNORMAL HIGH (ref 135–145)
Sodium: 148 mEq/L — ABNORMAL HIGH (ref 135–145)

## 2011-01-15 LAB — COMPREHENSIVE METABOLIC PANEL
ALT: 15 U/L (ref 0–35)
ALT: 49 U/L — ABNORMAL HIGH (ref 0–35)
ALT: 92 U/L — ABNORMAL HIGH (ref 0–35)
AST: 37 U/L (ref 0–37)
AST: 49 U/L — ABNORMAL HIGH (ref 0–37)
Alkaline Phosphatase: 57 U/L (ref 39–117)
Alkaline Phosphatase: 76 U/L (ref 39–117)
Alkaline Phosphatase: 79 U/L (ref 39–117)
BUN: 21 mg/dL (ref 6–23)
BUN: 5 mg/dL — ABNORMAL LOW (ref 6–23)
CO2: 23 mEq/L (ref 19–32)
CO2: 24 mEq/L (ref 19–32)
CO2: 24 mEq/L (ref 19–32)
CO2: 28 mEq/L (ref 19–32)
Calcium: 8 mg/dL — ABNORMAL LOW (ref 8.4–10.5)
Calcium: 8.5 mg/dL (ref 8.4–10.5)
Chloride: 106 mEq/L (ref 96–112)
GFR calc Af Amer: >60 mL/min (ref >60)
GFR calc Af Amer: >60 mL/min (ref >60)
GFR calc non Af Amer: >60 mL/min (ref >60)
GFR calc non Af Amer: >60 mL/min (ref >60)
GFR calc non Af Amer: >60 mL/min (ref >60)
GFR calc non Af Amer: >60 mL/min (ref >60)
Glucose, Bld: 154 mg/dL — ABNORMAL HIGH (ref 70–99)
Glucose, Bld: 197 mg/dL — ABNORMAL HIGH (ref 70–99)
Potassium: 3.2 mEq/L — ABNORMAL LOW (ref 3.5–5.1)
Potassium: 3.5 mEq/L (ref 3.5–5.1)
Potassium: 3.7 mEq/L (ref 3.5–5.1)
Potassium: 4.3 mEq/L (ref 3.5–5.1)
Sodium: 140 mEq/L (ref 135–145)
Sodium: 142 mEq/L (ref 135–145)
Sodium: 144 mEq/L (ref 135–145)
Total Bilirubin: 0.3 mg/dL (ref 0.3–1.2)
Total Protein: 5.5 g/dL — ABNORMAL LOW (ref 6.0–8.3)
Total Protein: 6.1 g/dL (ref 6.0–8.3)
Total Protein: 6.8 g/dL (ref 6.0–8.3)

## 2011-01-15 LAB — PHOSPHORUS: Phosphorus: 2.7 mg/dL (ref 2.3–4.6)

## 2011-01-15 LAB — URINALYSIS, ROUTINE W REFLEX MICROSCOPIC
Bilirubin Urine: NEGATIVE
Glucose, UA: NEGATIVE mg/dL
Glucose, UA: NEGATIVE mg/dL
Hgb urine dipstick: NEGATIVE
Ketones, ur: NEGATIVE mg/dL
Protein, ur: NEGATIVE mg/dL
Specific Gravity, Urine: 1.024 (ref 1.005–1.030)
pH: 5.5 (ref 5.0–8.0)

## 2011-01-15 LAB — TYPE AND SCREEN
ABO/RH(D): A POS
Antibody Screen: NEGATIVE
Unit division: 0

## 2011-01-15 LAB — CULTURE, RESPIRATORY W GRAM STAIN

## 2011-01-15 LAB — URINE MICROSCOPIC-ADD ON

## 2011-01-15 LAB — CULTURE, BLOOD (ROUTINE X 2)
Culture  Setup Time: 201112261956
Culture  Setup Time: 201112301428
Culture: NO GROWTH
Culture: NO GROWTH

## 2011-01-15 LAB — DIFFERENTIAL
Basophils Absolute: 0 10*3/uL (ref 0.0–0.1)
Basophils Relative: 0 % (ref 0–1)
Eosinophils Absolute: 0.1 10*3/uL (ref 0.0–0.7)
Eosinophils Absolute: 0.4 10*3/uL (ref 0.0–0.7)
Eosinophils Relative: 1 % (ref 0–5)
Lymphocytes Relative: 17 % (ref 12–46)
Lymphs Abs: 2.1 10*3/uL (ref 0.7–4.0)
Monocytes Relative: 10 % (ref 3–12)
Monocytes Relative: 7 % (ref 3–12)
Neutro Abs: 10 10*3/uL — ABNORMAL HIGH (ref 1.7–7.7)
Neutrophils Relative %: 77 % (ref 43–77)

## 2011-01-15 LAB — PROTIME-INR
INR: 0.98 (ref 0.00–1.49)
Prothrombin Time: 13.2 seconds (ref 11.6–15.2)

## 2011-01-15 LAB — MAGNESIUM: Magnesium: 2.5 mg/dL (ref 1.5–2.5)

## 2011-01-15 LAB — POCT I-STAT 3, ART BLOOD GAS (G3+)
Acid-base deficit: 2 mmol/L (ref 0.0–2.0)
O2 Saturation: 100 %
Patient temperature: 98.6
TCO2: 24 mmol/L (ref 0–100)

## 2011-01-15 LAB — URINE CULTURE
Colony Count: NO GROWTH
Culture  Setup Time: 201112161436

## 2011-01-15 LAB — APTT: aPTT: 28 seconds (ref 24–37)

## 2011-01-16 LAB — GLUCOSE, CAPILLARY
Glucose-Capillary: 122 mg/dL — ABNORMAL HIGH (ref 70–99)
Glucose-Capillary: 144 mg/dL — ABNORMAL HIGH (ref 70–99)
Glucose-Capillary: 159 mg/dL — ABNORMAL HIGH (ref 70–99)
Glucose-Capillary: 159 mg/dL — ABNORMAL HIGH (ref 70–99)
Glucose-Capillary: 160 mg/dL — ABNORMAL HIGH (ref 70–99)
Glucose-Capillary: 90 mg/dL (ref 70–99)
Glucose-Capillary: 92 mg/dL (ref 70–99)
Glucose-Capillary: 94 mg/dL (ref 70–99)

## 2011-01-17 LAB — GLUCOSE, CAPILLARY
Glucose-Capillary: 100 mg/dL — ABNORMAL HIGH (ref 70–99)
Glucose-Capillary: 104 mg/dL — ABNORMAL HIGH (ref 70–99)
Glucose-Capillary: 107 mg/dL — ABNORMAL HIGH (ref 70–99)
Glucose-Capillary: 123 mg/dL — ABNORMAL HIGH (ref 70–99)
Glucose-Capillary: 125 mg/dL — ABNORMAL HIGH (ref 70–99)
Glucose-Capillary: 126 mg/dL — ABNORMAL HIGH (ref 70–99)
Glucose-Capillary: 126 mg/dL — ABNORMAL HIGH (ref 70–99)
Glucose-Capillary: 130 mg/dL — ABNORMAL HIGH (ref 70–99)
Glucose-Capillary: 139 mg/dL — ABNORMAL HIGH (ref 70–99)
Glucose-Capillary: 153 mg/dL — ABNORMAL HIGH (ref 70–99)
Glucose-Capillary: 97 mg/dL (ref 70–99)

## 2011-01-17 LAB — HEPATIC FUNCTION PANEL
ALT: 12 U/L (ref 0–35)
Alkaline Phosphatase: 68 U/L (ref 39–117)
Bilirubin, Direct: <0.1 mg/dL (ref 0.0–0.3)
Total Protein: 6.6 g/dL (ref 6.0–8.3)

## 2011-01-19 ENCOUNTER — Inpatient Hospital Stay (HOSPITAL_COMMUNITY): Payer: Medicaid Other

## 2011-01-19 DIAGNOSIS — J9600 Acute respiratory failure, unspecified whether with hypoxia or hypercapnia: Secondary | ICD-10-CM

## 2011-01-19 DIAGNOSIS — S069XAA Unspecified intracranial injury with loss of consciousness status unknown, initial encounter: Secondary | ICD-10-CM

## 2011-01-19 DIAGNOSIS — S069X9A Unspecified intracranial injury with loss of consciousness of unspecified duration, initial encounter: Secondary | ICD-10-CM

## 2011-01-19 DIAGNOSIS — R1310 Dysphagia, unspecified: Secondary | ICD-10-CM

## 2011-01-19 LAB — DIFFERENTIAL
Basophils Absolute: 0 10*3/uL (ref 0.0–0.1)
Basophils Relative: 0 % (ref 0–1)
Lymphocytes Relative: 21 % (ref 12–46)
Neutro Abs: 6.6 10*3/uL (ref 1.7–7.7)
Neutrophils Relative %: 70 % (ref 43–77)

## 2011-01-19 LAB — CBC
HCT: 40 % (ref 36.0–46.0)
MCH: 31.9 pg (ref 26.0–34.0)
MCV: 93.6 fL (ref 78.0–100.0)
RBC: 4.28 MIL/uL (ref 3.87–5.11)
WBC: 9.4 10*3/uL (ref 4.0–10.5)

## 2011-01-19 LAB — WET PREP, GENITAL: Trich, Wet Prep: NONE SEEN

## 2011-01-19 LAB — HCG, QUANTITATIVE, PREGNANCY
hCG, Beta Chain, Quant, S: 4 m[IU]/mL (ref <5)
hCG, Beta Chain, Quant, S: 26 m[IU]/mL — ABNORMAL HIGH (ref <5)
hCG, Beta Chain, Quant, S: 55 m[IU]/mL — ABNORMAL HIGH (ref <5)

## 2011-01-19 LAB — COMPREHENSIVE METABOLIC PANEL
Alkaline Phosphatase: 69 U/L (ref 39–117)
BUN: 5 mg/dL — ABNORMAL LOW (ref 6–23)
Chloride: 105 mEq/L (ref 96–112)
Creatinine, Ser: 0.65 mg/dL (ref 0.4–1.2)
GFR calc non Af Amer: >60 mL/min (ref >60)
Glucose, Bld: 94 mg/dL (ref 70–99)
Potassium: 3.5 mEq/L (ref 3.5–5.1)
Total Bilirubin: 0.6 mg/dL (ref 0.3–1.2)

## 2011-01-20 LAB — URINALYSIS, ROUTINE W REFLEX MICROSCOPIC
Glucose, UA: NEGATIVE mg/dL
Ketones, ur: NEGATIVE mg/dL
Leukocytes, UA: NEGATIVE
Protein, ur: NEGATIVE mg/dL
Urobilinogen, UA: 0.2 mg/dL (ref 0.0–1.0)

## 2011-01-20 LAB — POCT PREGNANCY, URINE: Preg Test, Ur: NEGATIVE

## 2011-01-20 LAB — WET PREP, GENITAL
Trich, Wet Prep: NONE SEEN
Yeast Wet Prep HPF POC: NONE SEEN

## 2011-01-20 LAB — CBC
MCH: 32.2 pg (ref 26.0–34.0)
MCHC: 34.6 g/dL (ref 30.0–36.0)
MCV: 92.9 fL (ref 78.0–100.0)
Platelets: 253 10*3/uL (ref 150–400)
RBC: 4.21 MIL/uL (ref 3.87–5.11)
RDW: 13.5 % (ref 11.5–15.5)

## 2011-01-20 LAB — GC/CHLAMYDIA PROBE AMP, GENITAL
Chlamydia, DNA Probe: NEGATIVE
GC Probe Amp, Genital: NEGATIVE

## 2011-01-20 LAB — HCG, QUANTITATIVE, PREGNANCY: hCG, Beta Chain, Quant, S: 665 m[IU]/mL — ABNORMAL HIGH (ref <5)

## 2011-01-24 ENCOUNTER — Ambulatory Visit: Payer: Medicaid Other

## 2011-01-24 ENCOUNTER — Ambulatory Visit: Payer: Medicaid Other | Admitting: Occupational Therapy

## 2011-01-24 ENCOUNTER — Ambulatory Visit: Payer: Medicaid Other | Attending: Physical Medicine & Rehabilitation | Admitting: Physical Therapy

## 2011-01-24 DIAGNOSIS — S069XAS Unspecified intracranial injury with loss of consciousness status unknown, sequela: Secondary | ICD-10-CM | POA: Insufficient documentation

## 2011-01-24 DIAGNOSIS — M2560 Stiffness of unspecified joint, not elsewhere classified: Secondary | ICD-10-CM | POA: Insufficient documentation

## 2011-01-24 DIAGNOSIS — S069X9S Unspecified intracranial injury with loss of consciousness of unspecified duration, sequela: Secondary | ICD-10-CM | POA: Insufficient documentation

## 2011-01-24 DIAGNOSIS — R269 Unspecified abnormalities of gait and mobility: Secondary | ICD-10-CM | POA: Insufficient documentation

## 2011-01-24 DIAGNOSIS — F09 Unspecified mental disorder due to known physiological condition: Secondary | ICD-10-CM | POA: Insufficient documentation

## 2011-01-24 DIAGNOSIS — R279 Unspecified lack of coordination: Secondary | ICD-10-CM | POA: Insufficient documentation

## 2011-01-24 DIAGNOSIS — Z5189 Encounter for other specified aftercare: Secondary | ICD-10-CM | POA: Insufficient documentation

## 2011-01-24 DIAGNOSIS — R293 Abnormal posture: Secondary | ICD-10-CM | POA: Insufficient documentation

## 2011-01-24 DIAGNOSIS — M6281 Muscle weakness (generalized): Secondary | ICD-10-CM | POA: Insufficient documentation
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
## 2011-01-31 ENCOUNTER — Ambulatory Visit: Payer: Medicaid Other | Admitting: Occupational Therapy

## 2011-01-31 ENCOUNTER — Ambulatory Visit: Payer: Medicaid Other | Admitting: Physical Therapy

## 2011-02-05 ENCOUNTER — Ambulatory Visit: Payer: Medicaid Other | Admitting: Physical Therapy

## 2011-02-05 ENCOUNTER — Ambulatory Visit: Payer: Medicaid Other | Attending: Physical Medicine & Rehabilitation | Admitting: Occupational Therapy

## 2011-02-05 ENCOUNTER — Other Ambulatory Visit: Payer: Self-pay | Admitting: Neurosurgery

## 2011-02-05 DIAGNOSIS — M2560 Stiffness of unspecified joint, not elsewhere classified: Secondary | ICD-10-CM | POA: Insufficient documentation

## 2011-02-05 DIAGNOSIS — Z5189 Encounter for other specified aftercare: Secondary | ICD-10-CM | POA: Insufficient documentation

## 2011-02-05 DIAGNOSIS — M6281 Muscle weakness (generalized): Secondary | ICD-10-CM | POA: Insufficient documentation

## 2011-02-05 DIAGNOSIS — I6200 Nontraumatic subdural hemorrhage, unspecified: Secondary | ICD-10-CM

## 2011-02-05 DIAGNOSIS — S069X9S Unspecified intracranial injury with loss of consciousness of unspecified duration, sequela: Secondary | ICD-10-CM | POA: Insufficient documentation

## 2011-02-05 DIAGNOSIS — R269 Unspecified abnormalities of gait and mobility: Secondary | ICD-10-CM | POA: Insufficient documentation

## 2011-02-05 DIAGNOSIS — S069XAS Unspecified intracranial injury with loss of consciousness status unknown, sequela: Secondary | ICD-10-CM | POA: Insufficient documentation

## 2011-02-05 DIAGNOSIS — R293 Abnormal posture: Secondary | ICD-10-CM | POA: Insufficient documentation

## 2011-02-05 DIAGNOSIS — F09 Unspecified mental disorder due to known physiological condition: Secondary | ICD-10-CM | POA: Insufficient documentation

## 2011-02-05 DIAGNOSIS — R279 Unspecified lack of coordination: Secondary | ICD-10-CM | POA: Insufficient documentation

## 2011-02-06 ENCOUNTER — Ambulatory Visit: Payer: Medicaid Other

## 2011-02-07 ENCOUNTER — Ambulatory Visit: Payer: Medicaid Other | Admitting: Physical Therapy

## 2011-02-07 ENCOUNTER — Ambulatory Visit: Payer: Medicaid Other | Admitting: Occupational Therapy

## 2011-02-13 ENCOUNTER — Ambulatory Visit: Payer: Medicaid Other | Admitting: Occupational Therapy

## 2011-02-13 ENCOUNTER — Ambulatory Visit: Payer: Medicaid Other | Admitting: Physical Therapy

## 2011-02-13 ENCOUNTER — Ambulatory Visit: Payer: Medicaid Other

## 2011-02-15 ENCOUNTER — Ambulatory Visit: Payer: Medicaid Other | Admitting: Occupational Therapy

## 2011-02-15 ENCOUNTER — Ambulatory Visit: Payer: Medicaid Other | Admitting: Physical Therapy

## 2011-02-15 ENCOUNTER — Ambulatory Visit: Payer: Medicaid Other

## 2011-02-20 ENCOUNTER — Ambulatory Visit: Payer: No Typology Code available for payment source | Admitting: Physical Therapy

## 2011-02-20 ENCOUNTER — Encounter: Payer: No Typology Code available for payment source | Admitting: Occupational Therapy

## 2011-02-22 ENCOUNTER — Ambulatory Visit: Payer: Medicaid Other

## 2011-02-22 ENCOUNTER — Ambulatory Visit: Payer: Medicaid Other | Admitting: Physical Therapy

## 2011-02-22 ENCOUNTER — Ambulatory Visit: Payer: Medicaid Other | Admitting: Occupational Therapy

## 2011-02-26 ENCOUNTER — Ambulatory Visit: Payer: Medicaid Other | Admitting: Physical Therapy

## 2011-02-26 ENCOUNTER — Ambulatory Visit: Payer: Medicaid Other | Admitting: Occupational Therapy

## 2011-02-26 ENCOUNTER — Encounter: Payer: Medicaid Other | Attending: Physical Medicine & Rehabilitation | Admitting: Physical Medicine & Rehabilitation

## 2011-02-26 ENCOUNTER — Ambulatory Visit: Payer: Medicaid Other

## 2011-02-26 DIAGNOSIS — G8110 Spastic hemiplegia affecting unspecified side: Secondary | ICD-10-CM

## 2011-02-26 DIAGNOSIS — G8190 Hemiplegia, unspecified affecting unspecified side: Secondary | ICD-10-CM | POA: Insufficient documentation

## 2011-02-26 DIAGNOSIS — S069X9A Unspecified intracranial injury with loss of consciousness of unspecified duration, initial encounter: Secondary | ICD-10-CM

## 2011-02-26 DIAGNOSIS — R350 Frequency of micturition: Secondary | ICD-10-CM | POA: Insufficient documentation

## 2011-02-26 DIAGNOSIS — S069X9S Unspecified intracranial injury with loss of consciousness of unspecified duration, sequela: Secondary | ICD-10-CM | POA: Insufficient documentation

## 2011-02-26 DIAGNOSIS — X58XXXS Exposure to other specified factors, sequela: Secondary | ICD-10-CM | POA: Insufficient documentation

## 2011-02-26 DIAGNOSIS — S069XAS Unspecified intracranial injury with loss of consciousness status unknown, sequela: Secondary | ICD-10-CM | POA: Insufficient documentation

## 2011-02-26 DIAGNOSIS — F070 Personality change due to known physiological condition: Secondary | ICD-10-CM

## 2011-02-27 NOTE — Assessment & Plan Note (Signed)
[name] is back regarding her severe traumatic brain injury.  She has done quite nicely at home.  She is in outpatient therapy currently and they are working on cognitive and mobility aspects.  She remains on the Dantrium.  Spasticity has been an issue on the left side.  I know they talked about doing some splinting of the left ankle and therapy as well. Family reports urinary frequency, especially when home but no frank incontinence.  These have been much improved and cognitively, she is doing much better.  She is better conversationally.  She will occasionally fluctuate and arousal and overall neurological picture from day-to-day without any obvious reasons why.  Family does report some depression, occasional anxiety.  She is perseverative at times.  Full 12- point review is in the written H and P.  SOCIAL HISTORY:  The patient is single, living with dad and step-mother. Step-mom and mother are here today.  PHYSICAL EXAMINATION:  VITAL SIGNS:  Blood pressure is 116/74, pulse is 70, respiratory rate 18.  She is satting 100% on room air. GENERAL:  The patient is pleasant, alert, and oriented x3.  She is very good Geophysical data processor.  She does lack deeper awareness and insight.  She was not perseverative today.  Attention is somewhat limited. NEUROLOGIC:  She has significant spasticity in the left upper extremity with 1+/4 at the left pectoralis minor, 3/4 left biceps and perhaps brachioradialis and brachialis.  Hand and wrist are trace.  She has tone at the left gastrocs, I believe, but also some underlying heel cord contracture.  She walks with a steppage type gait and has a hard time swinging her leg naturally on the left.  Sensory exam is grossly intact. Cranial nerve exam is grossly intact as well.  Reflexes are 3+ on the left.  ASSESSMENT: 1. Severe traumatic brain injury with dramatic improvements. 2. Spastic left hemiparesis. 3. Urinary frequency, which is partly  behavioral.  PLAN: 1. I encourage regular voiding times at home, to be placed on schedule     visible to the patient.  She also needs some distraction and needs     to learn to stretch these bathroom visits further apart.  I think,     this will improve with time.  If it does not, we can look at     something for spastic bladder. 2. Regarding her motor spasticity, we will set up for Botox injections     at left pectoralis minor, biceps, and brachialis as well as     brachioradialis and gastrocnemius, totalling 300 units of Botox.     Also, I will talk with PT regarding splinting of the left ankle as     well as heel cord. 3. I will see her back pending injections above. 4. I would like to also continue with her Celexa, dantrolene, Ritalin,     Inderal as currently dosed with p.r.n. Ultram for pain and schedule     trazodone.     Ranelle Oyster, M.D. Electronically Signed    ZTS/MedQ D:  02/26/2011 13:17:03  T:  02/27/2011 00:51:00  Job #:  [id number]  cc:   [facility]

## 2011-03-07 ENCOUNTER — Ambulatory Visit: Payer: Medicaid Other | Attending: Physical Medicine & Rehabilitation | Admitting: Physical Therapy

## 2011-03-07 DIAGNOSIS — R293 Abnormal posture: Secondary | ICD-10-CM | POA: Insufficient documentation

## 2011-03-07 DIAGNOSIS — S069XAS Unspecified intracranial injury with loss of consciousness status unknown, sequela: Secondary | ICD-10-CM | POA: Insufficient documentation

## 2011-03-07 DIAGNOSIS — F09 Unspecified mental disorder due to known physiological condition: Secondary | ICD-10-CM | POA: Insufficient documentation

## 2011-03-07 DIAGNOSIS — S069X9S Unspecified intracranial injury with loss of consciousness of unspecified duration, sequela: Secondary | ICD-10-CM | POA: Insufficient documentation

## 2011-03-07 DIAGNOSIS — Z5189 Encounter for other specified aftercare: Secondary | ICD-10-CM | POA: Insufficient documentation

## 2011-03-07 DIAGNOSIS — M2560 Stiffness of unspecified joint, not elsewhere classified: Secondary | ICD-10-CM | POA: Insufficient documentation

## 2011-03-07 DIAGNOSIS — M6281 Muscle weakness (generalized): Secondary | ICD-10-CM | POA: Insufficient documentation

## 2011-03-07 DIAGNOSIS — R269 Unspecified abnormalities of gait and mobility: Secondary | ICD-10-CM | POA: Insufficient documentation

## 2011-03-07 DIAGNOSIS — R279 Unspecified lack of coordination: Secondary | ICD-10-CM | POA: Insufficient documentation

## 2011-03-08 ENCOUNTER — Other Ambulatory Visit: Payer: No Typology Code available for payment source

## 2011-03-08 ENCOUNTER — Ambulatory Visit
Admission: RE | Admit: 2011-03-08 | Discharge: 2011-03-08 | Disposition: A | Discharge status: Home / Self Care | Payer: Medicaid Other | Source: Ambulatory Visit | Attending: Neurosurgery | Admitting: Neurosurgery

## 2011-03-08 DIAGNOSIS — I6200 Nontraumatic subdural hemorrhage, unspecified: Secondary | ICD-10-CM

## 2011-03-09 ENCOUNTER — Ambulatory Visit: Payer: Medicaid Other | Admitting: Physical Therapy

## 2011-03-12 ENCOUNTER — Ambulatory Visit: Payer: Medicaid Other | Admitting: Physical Therapy

## 2011-03-12 ENCOUNTER — Ambulatory Visit: Payer: Medicaid Other

## 2011-03-14 ENCOUNTER — Ambulatory Visit: Payer: Medicaid Other | Admitting: Physical Therapy

## 2011-03-19 ENCOUNTER — Other Ambulatory Visit (HOSPITAL_COMMUNITY): Payer: Medicaid Other

## 2011-03-20 ENCOUNTER — Other Ambulatory Visit (HOSPITAL_COMMUNITY): Payer: Self-pay | Admitting: Neurosurgery

## 2011-03-20 ENCOUNTER — Ambulatory Visit (HOSPITAL_COMMUNITY)
Admission: RE | Admit: 2011-03-20 | Discharge: 2011-03-20 | Disposition: A | Discharge status: Home / Self Care | Payer: Medicaid Other | Source: Ambulatory Visit | Attending: Neurosurgery | Admitting: Neurosurgery

## 2011-03-20 ENCOUNTER — Encounter (HOSPITAL_COMMUNITY)
Admission: RE | Admit: 2011-03-20 | Discharge: 2011-03-20 | Disposition: A | Discharge status: Home / Self Care | Payer: Medicaid Other | Source: Ambulatory Visit | Attending: Neurosurgery | Admitting: Neurosurgery

## 2011-03-20 DIAGNOSIS — X58XXXA Exposure to other specified factors, initial encounter: Secondary | ICD-10-CM | POA: Insufficient documentation

## 2011-03-20 DIAGNOSIS — Z01810 Encounter for preprocedural cardiovascular examination: Secondary | ICD-10-CM | POA: Insufficient documentation

## 2011-03-20 DIAGNOSIS — Z01811 Encounter for preprocedural respiratory examination: Secondary | ICD-10-CM

## 2011-03-20 DIAGNOSIS — Z01812 Encounter for preprocedural laboratory examination: Secondary | ICD-10-CM | POA: Insufficient documentation

## 2011-03-20 DIAGNOSIS — S069X0A Unspecified intracranial injury without loss of consciousness, initial encounter: Secondary | ICD-10-CM | POA: Insufficient documentation

## 2011-03-20 LAB — CBC
HCT: 40.9 % (ref 36.0–46.0)
Hemoglobin: 13.7 g/dL (ref 12.0–15.0)
MCH: 29.3 pg (ref 26.0–34.0)
RBC: 4.67 MIL/uL (ref 3.87–5.11)

## 2011-03-20 LAB — SURGICAL PCR SCREEN
MRSA, PCR: NEGATIVE
Staphylococcus aureus: NEGATIVE

## 2011-03-20 LAB — BASIC METABOLIC PANEL
BUN: 6 mg/dL (ref 6–23)
CO2: 31 mEq/L (ref 19–32)
Chloride: 101 mEq/L (ref 96–112)
GFR calc Af Amer: >60 mL/min (ref >60)
Potassium: 4.5 mEq/L (ref 3.5–5.1)

## 2011-03-20 LAB — HCG, SERUM, QUALITATIVE: Preg, Serum: NEGATIVE

## 2011-03-21 ENCOUNTER — Ambulatory Visit: Payer: Medicaid Other

## 2011-03-21 ENCOUNTER — Ambulatory Visit: Payer: Medicaid Other | Admitting: Occupational Therapy

## 2011-03-23 ENCOUNTER — Inpatient Hospital Stay (HOSPITAL_COMMUNITY)
Admission: RE | Admit: 2011-03-23 | Discharge: 2011-03-26 | DRG: 033 | Disposition: A | Discharge status: Home / Self Care | Payer: Medicaid Other | Source: Ambulatory Visit | Attending: Neurosurgery | Admitting: Neurosurgery

## 2011-03-23 DIAGNOSIS — R5381 Other malaise: Secondary | ICD-10-CM | POA: Diagnosis present

## 2011-03-23 DIAGNOSIS — G911 Obstructive hydrocephalus: Principal | ICD-10-CM | POA: Diagnosis present

## 2011-03-23 DIAGNOSIS — S069X9S Unspecified intracranial injury with loss of consciousness of unspecified duration, sequela: Secondary | ICD-10-CM

## 2011-03-23 DIAGNOSIS — R112 Nausea with vomiting, unspecified: Secondary | ICD-10-CM | POA: Diagnosis not present

## 2011-03-23 DIAGNOSIS — R269 Unspecified abnormalities of gait and mobility: Secondary | ICD-10-CM | POA: Diagnosis present

## 2011-03-23 DIAGNOSIS — S069XAS Unspecified intracranial injury with loss of consciousness status unknown, sequela: Secondary | ICD-10-CM

## 2011-03-23 DIAGNOSIS — R5383 Other fatigue: Secondary | ICD-10-CM | POA: Diagnosis present

## 2011-03-23 DIAGNOSIS — Z87820 Personal history of traumatic brain injury: Secondary | ICD-10-CM

## 2011-03-25 LAB — PROTIME-INR: INR: 1.07 (ref 0.00–1.49)

## 2011-03-26 LAB — PROTIME-INR: Prothrombin Time: 13.4 seconds (ref 11.6–15.2)

## 2011-03-27 NOTE — Discharge Summary (Signed)
  NAMEALEXIE, [name]               ACCOUNT NO.:  [id number]  MEDICAL RECORD NO.:  [id number]           PATIENT TYPE:  I  LOCATION:  3020                         FACILITY:  [facility]  PHYSICIAN:  Hilda Lias, M.D.   DATE OF BIRTH:  1981/02/16  DATE OF ADMISSION:  03/23/2011 DATE OF DISCHARGE:  03/26/2011                              DISCHARGE SUMMARY   ADMISSION DIAGNOSIS:  Hydrocephalus, closed head injury.  FINAL DIAGNOSIS:  Hydrocephalus, closed head injury.  CLINICAL HISTORY:  The patient was admitted because of hydrocephalus. The patient had severe closed head injury several months ago.  X-rays showed that she has been having increase of the ventricles.  Surgery was advised.  LABORATORY DATA:  Normal.  COURSE IN THE HOSPITAL:  The patient was taken to Surgery and right VP shunt was inserted.  Today, she is walking.  She is ambulating.  She has minimal discomfort.  She is being discharged to be followed by me.  CONDITION ON DISCHARGE:  Improvement.  MEDICATION:  Tylenol.  DIET:  Regular.  ACTIVITY:  Not to drive.  FOLLOWUP:  She will be seen by me in 10 days to have her staples out.          ______________________________ Hilda Lias, M.D.     EB/MEDQ  D:  03/26/2011  T:  03/26/2011  Job:  [id number]  Electronically Signed by Hilda Lias M.D. on 03/27/2011 11:05:46 AM
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
## 2011-04-17 ENCOUNTER — Ambulatory Visit: Payer: Medicaid Other | Admitting: Physical Therapy

## 2011-04-17 ENCOUNTER — Ambulatory Visit: Payer: Medicaid Other | Attending: Physical Medicine & Rehabilitation | Admitting: Occupational Therapy

## 2011-04-17 DIAGNOSIS — R293 Abnormal posture: Secondary | ICD-10-CM | POA: Insufficient documentation

## 2011-04-17 DIAGNOSIS — R269 Unspecified abnormalities of gait and mobility: Secondary | ICD-10-CM | POA: Insufficient documentation

## 2011-04-17 DIAGNOSIS — S069XAS Unspecified intracranial injury with loss of consciousness status unknown, sequela: Secondary | ICD-10-CM | POA: Insufficient documentation

## 2011-04-17 DIAGNOSIS — Z5189 Encounter for other specified aftercare: Secondary | ICD-10-CM | POA: Insufficient documentation

## 2011-04-17 DIAGNOSIS — R279 Unspecified lack of coordination: Secondary | ICD-10-CM | POA: Insufficient documentation

## 2011-04-17 DIAGNOSIS — F09 Unspecified mental disorder due to known physiological condition: Secondary | ICD-10-CM | POA: Insufficient documentation

## 2011-04-17 DIAGNOSIS — S069X9S Unspecified intracranial injury with loss of consciousness of unspecified duration, sequela: Secondary | ICD-10-CM | POA: Insufficient documentation

## 2011-04-17 DIAGNOSIS — M6281 Muscle weakness (generalized): Secondary | ICD-10-CM | POA: Insufficient documentation

## 2011-04-17 DIAGNOSIS — M2560 Stiffness of unspecified joint, not elsewhere classified: Secondary | ICD-10-CM | POA: Insufficient documentation

## 2011-04-24 ENCOUNTER — Ambulatory Visit: Payer: Medicaid Other | Admitting: Physical Therapy

## 2011-04-24 ENCOUNTER — Encounter: Payer: Medicaid Other | Admitting: Occupational Therapy

## 2011-05-01 ENCOUNTER — Ambulatory Visit: Payer: Medicaid Other | Admitting: Physical Therapy

## 2011-05-01 ENCOUNTER — Ambulatory Visit: Payer: Medicaid Other

## 2011-05-01 ENCOUNTER — Ambulatory Visit: Payer: Medicaid Other | Admitting: Occupational Therapy

## 2011-05-02 ENCOUNTER — Encounter: Payer: Medicaid Other | Attending: Physical Medicine & Rehabilitation | Admitting: Physical Medicine & Rehabilitation

## 2011-05-02 DIAGNOSIS — X58XXXA Exposure to other specified factors, initial encounter: Secondary | ICD-10-CM | POA: Insufficient documentation

## 2011-05-02 DIAGNOSIS — G8110 Spastic hemiplegia affecting unspecified side: Secondary | ICD-10-CM | POA: Insufficient documentation

## 2011-05-02 DIAGNOSIS — IMO0002 Reserved for concepts with insufficient information to code with codable children: Secondary | ICD-10-CM | POA: Insufficient documentation

## 2011-05-03 ENCOUNTER — Ambulatory Visit: Payer: Medicaid Other

## 2011-05-03 ENCOUNTER — Ambulatory Visit: Payer: Medicaid Other | Admitting: Occupational Therapy

## 2011-05-03 ENCOUNTER — Ambulatory Visit: Payer: Medicaid Other | Admitting: Physical Therapy

## 2011-05-03 NOTE — Procedures (Signed)
[name], SPURGIN               ACCOUNT NO.:  [id number]  MEDICAL RECORD NO.:  [id number]           PATIENT TYPE:  O  LOCATION:  PRM                          FACILITY:  [facility]  PHYSICIAN:  Ranelle Oyster, M.D.DATE OF BIRTH:  1981-03-04  DATE OF PROCEDURE:  05/02/2011 DATE OF DISCHARGE:                              OPERATIVE REPORT  PROCEDURE:  Botox injection.  DIAGNOSTIC CODE:  342.12 spastic hemiparesis, nondominant limb.  PROCEDURE:  After informed consent, I injected 300 units of botulinum toxin type A into the left biceps, 200 units into the gastrocnemius muscle equally distributed.  I utilized 3 separate access points for the gastrocnemius muscles and 4 separate for the biceps brachii.  Each 100 units of Botox diluted in 1 mL preservative-free normal saline. Isopropyl alcohol was used for cleansing of the skin.  We discussed with simple stretching exercises.  The patient also will follow up with therapy afterwards.  Incidentally, we noticed the blister on the left heel.  I will have her see advanced prosthetics and orthotics for adjustment of her AFO.  It looks infection burden there.  Some of which related to her heel cord tightness also.     Ranelle Oyster, M.D. Electronically Signed    ZTS/MEDQ  D:  05/02/2011 13:51:49  T:  05/03/2011 00:12:03  Job:  [id number]

## 2011-05-08 ENCOUNTER — Ambulatory Visit: Payer: Medicaid Other | Admitting: Occupational Therapy

## 2011-05-08 ENCOUNTER — Ambulatory Visit: Payer: Medicaid Other | Attending: Pediatrics | Admitting: Physical Therapy

## 2011-05-08 ENCOUNTER — Ambulatory Visit: Payer: Medicaid Other

## 2011-05-08 DIAGNOSIS — R269 Unspecified abnormalities of gait and mobility: Secondary | ICD-10-CM | POA: Insufficient documentation

## 2011-05-08 DIAGNOSIS — M629 Disorder of muscle, unspecified: Secondary | ICD-10-CM | POA: Insufficient documentation

## 2011-05-08 DIAGNOSIS — M6281 Muscle weakness (generalized): Secondary | ICD-10-CM | POA: Insufficient documentation

## 2011-05-08 DIAGNOSIS — R279 Unspecified lack of coordination: Secondary | ICD-10-CM | POA: Insufficient documentation

## 2011-05-08 DIAGNOSIS — M2420 Disorder of ligament, unspecified site: Secondary | ICD-10-CM | POA: Insufficient documentation

## 2011-05-08 DIAGNOSIS — M2560 Stiffness of unspecified joint, not elsewhere classified: Secondary | ICD-10-CM | POA: Insufficient documentation

## 2011-05-08 DIAGNOSIS — Z5189 Encounter for other specified aftercare: Secondary | ICD-10-CM | POA: Insufficient documentation

## 2011-05-08 DIAGNOSIS — I69919 Unspecified symptoms and signs involving cognitive functions following unspecified cerebrovascular disease: Secondary | ICD-10-CM | POA: Insufficient documentation

## 2011-05-08 DIAGNOSIS — I69998 Other sequelae following unspecified cerebrovascular disease: Secondary | ICD-10-CM | POA: Insufficient documentation

## 2011-05-10 ENCOUNTER — Ambulatory Visit: Payer: Medicaid Other | Admitting: Physical Therapy

## 2011-05-14 ENCOUNTER — Ambulatory Visit: Payer: Medicaid Other | Admitting: Physical Therapy

## 2011-05-14 ENCOUNTER — Ambulatory Visit: Payer: Medicaid Other

## 2011-05-14 ENCOUNTER — Ambulatory Visit: Payer: Medicaid Other | Admitting: Occupational Therapy

## 2011-05-16 ENCOUNTER — Ambulatory Visit: Payer: Medicaid Other | Admitting: Occupational Therapy

## 2011-05-16 ENCOUNTER — Ambulatory Visit: Payer: Medicaid Other | Admitting: Physical Therapy

## 2011-05-16 ENCOUNTER — Ambulatory Visit: Payer: Medicaid Other

## 2011-05-23 ENCOUNTER — Ambulatory Visit: Payer: Medicaid Other

## 2011-05-23 ENCOUNTER — Ambulatory Visit: Payer: Medicaid Other | Admitting: Occupational Therapy

## 2011-05-23 ENCOUNTER — Ambulatory Visit: Payer: Medicaid Other | Admitting: Physical Therapy

## 2011-05-25 ENCOUNTER — Ambulatory Visit: Payer: Medicaid Other

## 2011-05-25 ENCOUNTER — Ambulatory Visit: Payer: Medicaid Other | Admitting: Rehabilitative and Restorative Service Providers"

## 2011-05-25 ENCOUNTER — Ambulatory Visit: Payer: Medicaid Other | Admitting: Occupational Therapy

## 2011-05-25 ENCOUNTER — Other Ambulatory Visit: Payer: Self-pay | Admitting: Neurosurgery

## 2011-05-25 DIAGNOSIS — G919 Hydrocephalus, unspecified: Secondary | ICD-10-CM

## 2011-05-29 ENCOUNTER — Ambulatory Visit: Payer: Medicaid Other | Admitting: Occupational Therapy

## 2011-05-29 ENCOUNTER — Ambulatory Visit: Payer: Medicaid Other | Admitting: *Deleted

## 2011-05-29 ENCOUNTER — Ambulatory Visit
Admission: RE | Admit: 2011-05-29 | Discharge: 2011-05-29 | Disposition: A | Discharge status: Home / Self Care | Payer: Medicaid Other | Source: Ambulatory Visit | Attending: Neurosurgery | Admitting: Neurosurgery

## 2011-05-29 ENCOUNTER — Ambulatory Visit: Payer: Medicaid Other | Admitting: Physical Therapy

## 2011-05-29 DIAGNOSIS — G919 Hydrocephalus, unspecified: Secondary | ICD-10-CM

## 2011-06-01 ENCOUNTER — Ambulatory Visit: Payer: Medicaid Other | Admitting: Physical Therapy

## 2011-06-01 ENCOUNTER — Ambulatory Visit: Payer: Medicaid Other | Admitting: Occupational Therapy

## 2011-06-01 ENCOUNTER — Ambulatory Visit: Payer: Medicaid Other | Admitting: *Deleted

## 2011-06-05 ENCOUNTER — Ambulatory Visit: Payer: Medicaid Other

## 2011-06-05 ENCOUNTER — Ambulatory Visit: Payer: Medicaid Other | Admitting: Occupational Therapy

## 2011-06-05 ENCOUNTER — Ambulatory Visit: Payer: Medicaid Other | Admitting: Physical Therapy

## 2011-06-07 ENCOUNTER — Ambulatory Visit: Payer: Medicaid Other | Attending: Physical Medicine & Rehabilitation | Admitting: Occupational Therapy

## 2011-06-07 ENCOUNTER — Ambulatory Visit: Payer: Medicaid Other

## 2011-06-07 ENCOUNTER — Ambulatory Visit: Payer: Medicaid Other | Admitting: Physical Therapy

## 2011-06-07 DIAGNOSIS — R269 Unspecified abnormalities of gait and mobility: Secondary | ICD-10-CM | POA: Insufficient documentation

## 2011-06-07 DIAGNOSIS — S069X9S Unspecified intracranial injury with loss of consciousness of unspecified duration, sequela: Secondary | ICD-10-CM | POA: Insufficient documentation

## 2011-06-07 DIAGNOSIS — R293 Abnormal posture: Secondary | ICD-10-CM | POA: Insufficient documentation

## 2011-06-07 DIAGNOSIS — R279 Unspecified lack of coordination: Secondary | ICD-10-CM | POA: Insufficient documentation

## 2011-06-07 DIAGNOSIS — S069XAS Unspecified intracranial injury with loss of consciousness status unknown, sequela: Secondary | ICD-10-CM | POA: Insufficient documentation

## 2011-06-07 DIAGNOSIS — M6281 Muscle weakness (generalized): Secondary | ICD-10-CM | POA: Insufficient documentation

## 2011-06-07 DIAGNOSIS — M2560 Stiffness of unspecified joint, not elsewhere classified: Secondary | ICD-10-CM | POA: Insufficient documentation

## 2011-06-07 DIAGNOSIS — Z5189 Encounter for other specified aftercare: Secondary | ICD-10-CM | POA: Insufficient documentation

## 2011-06-07 DIAGNOSIS — F09 Unspecified mental disorder due to known physiological condition: Secondary | ICD-10-CM | POA: Insufficient documentation

## 2011-06-12 ENCOUNTER — Ambulatory Visit: Payer: Medicaid Other | Admitting: Physical Therapy

## 2011-06-12 ENCOUNTER — Ambulatory Visit: Payer: Medicaid Other | Admitting: Occupational Therapy

## 2011-06-12 ENCOUNTER — Ambulatory Visit: Payer: Medicaid Other

## 2011-06-14 ENCOUNTER — Ambulatory Visit: Payer: Medicaid Other

## 2011-06-14 ENCOUNTER — Ambulatory Visit: Payer: Medicaid Other | Admitting: Occupational Therapy

## 2011-06-14 ENCOUNTER — Ambulatory Visit: Payer: Medicaid Other | Admitting: Physical Therapy

## 2011-06-20 ENCOUNTER — Ambulatory Visit: Payer: Medicaid Other | Admitting: Occupational Therapy

## 2011-06-20 ENCOUNTER — Ambulatory Visit: Payer: Medicaid Other | Admitting: Physical Therapy

## 2011-06-27 ENCOUNTER — Encounter: Payer: Medicaid Other | Attending: Physical Medicine & Rehabilitation | Admitting: Physical Medicine & Rehabilitation

## 2011-06-27 DIAGNOSIS — S069X9A Unspecified intracranial injury with loss of consciousness of unspecified duration, initial encounter: Secondary | ICD-10-CM

## 2011-06-27 DIAGNOSIS — G8110 Spastic hemiplegia affecting unspecified side: Secondary | ICD-10-CM | POA: Insufficient documentation

## 2011-06-27 DIAGNOSIS — R350 Frequency of micturition: Secondary | ICD-10-CM | POA: Insufficient documentation

## 2011-06-27 DIAGNOSIS — F070 Personality change due to known physiological condition: Secondary | ICD-10-CM

## 2011-06-27 DIAGNOSIS — X58XXXS Exposure to other specified factors, sequela: Secondary | ICD-10-CM | POA: Insufficient documentation

## 2011-06-27 DIAGNOSIS — S069XAS Unspecified intracranial injury with loss of consciousness status unknown, sequela: Secondary | ICD-10-CM | POA: Insufficient documentation

## 2011-06-27 DIAGNOSIS — S069X9S Unspecified intracranial injury with loss of consciousness of unspecified duration, sequela: Secondary | ICD-10-CM | POA: Insufficient documentation

## 2011-06-28 NOTE — Assessment & Plan Note (Signed)
HISTORY OF PRESENT ILLNESS:  [name] is back regarding spastic left hemiparesis related to her traumatic brain injury as well as her overall cognitive picture.  She had good results with Botox injection with improvement of left leg movement and arm.  She still has some tightness in both areas.  We did start Ditropan last visit for urinary frequency but this did not make a big difference.  Often Yalexa has complained me she needed to go to the bathroom and then when she is right there is unable to void.  There is no incontinent episodes noted.  Pain right now is generally 1/2.  She has been more interactive with her family and her son in particular.  She still needs some distant supervision due to memory and attention issues.  REVIEW OF SYSTEMS:  Notable for the above.  A full 12 point review is in the written health and history section of the chart.  SOCIAL HISTORY:  The patient is single living with her son and mother who are both with her today.  PHYSICAL EXAMINATION:  VITAL SIGNS:  Blood pressure is 125/63, pulse is 102, respiratory rate is 18, she is satting 99% on room air.  The patient is very alert and appropriate although memory is poor as well as attention.  She asked several questions repeatedly in the office today. She walks still with a steppage type of gait but spasticity has improved in the left ankle.  It is better passive and active calf movement and ankle range of motion.  Left biceps is only 1/4 triceps actually was bit tighter today 1+ to 2/4 and she is tight in the pec major minor perhaps even into the latissimus dorsi at 2/4.  Reflexes are 3+ on the left side in general.  Skin is intact.  ASSESSMENT: 1. Severe traumatic brain injury. 2. Spastic left hemiparesis. 3. Urinary frequency which I believe remains largely behavioral, but     cannot rule out overactive bladder.  PLAN: 1. We will start her on Detrol LA 4 mg as she tolerates this and see     if she has  any benefit.  I will make a referral to Urology to see     if they can assess her more in detail to rule out any     anatomic/physiological cause. 2. We set her up again for Botox injections to the left triceps as     well as the pectoralis major, minor, and latissimus dorsi. 3. I will see her back in a month for these.  I did refill her Ritalin     today.  Incidentally, the patient did talk of some chest pain from     yesterday, and we need to be aware of Ritalin as a potential source     for this.  Particularly, it may cause tachycardia and palpitations.     I asked mother to watch this closely and we will make changes as     appropriate.     Ranelle Oyster, M.D. Electronically Signed    ZTS/MedQ D:  06/27/2011 12:29:21  T:  06/27/2011 21:07:20  Job #:  [id number]  cc:   [facility]

## 2011-07-16 ENCOUNTER — Ambulatory Visit: Payer: Medicaid Other | Admitting: Physical Medicine & Rehabilitation

## 2011-07-17 ENCOUNTER — Encounter: Payer: Medicaid Other | Attending: Physical Medicine & Rehabilitation | Admitting: Physical Medicine & Rehabilitation

## 2011-07-17 DIAGNOSIS — G8110 Spastic hemiplegia affecting unspecified side: Secondary | ICD-10-CM | POA: Insufficient documentation

## 2011-07-19 NOTE — Assessment & Plan Note (Signed)
PROCEDURE:  Botox injection.  DIAGNOSTIC CODE:  342.12, spastic hemiparesis nondominant limb.  After informed consent and preparation of the skin as well as time-out, we injected the following muscles with Botox.  Left latissimus dorsi and left pectoralis major, left pec minor and left biceps today.  I injected 50 units into the lats, 75 units into the pec minor and 75 units pec major and in addition to 100 units into the biceps and 2 separate axis points.  Each 100 units of Botox diluted at 1 mL preservative-free normal saline.  I utilized EMG guidance to provide these injections today.  The patient tolerated quite well and was given post injection instructions.  We will consider followup injections to the left lower extremity including the gastrocs and the tibialis posterior depending on presentation and when I see her back in about 2 months' time.     Ranelle Oyster, M.D. Electronically Signed    ZTS/MedQ D:  07/17/2011 13:14:49  T:  07/17/2011 21:37:42  Job #:  [id number]

## 2011-07-30 LAB — URINALYSIS, ROUTINE W REFLEX MICROSCOPIC
Bilirubin Urine: NEGATIVE
Hgb urine dipstick: NEGATIVE
Protein, ur: NEGATIVE
Urobilinogen, UA: 0.2

## 2011-07-30 LAB — WET PREP, GENITAL
Clue Cells Wet Prep HPF POC: NONE SEEN
Trich, Wet Prep: NONE SEEN

## 2011-07-31 LAB — URINALYSIS, ROUTINE W REFLEX MICROSCOPIC
Bilirubin Urine: NEGATIVE
Glucose, UA: NEGATIVE
Hgb urine dipstick: NEGATIVE
Ketones, ur: NEGATIVE
Protein, ur: NEGATIVE

## 2011-07-31 LAB — CBC
HCT: 29.6 — ABNORMAL LOW
MCHC: 35.5
MCV: 94.1
Platelets: 302
RDW: 14.2

## 2011-07-31 LAB — DIFFERENTIAL
Basophils Absolute: 0
Eosinophils Absolute: 0.2
Eosinophils Relative: 1

## 2011-07-31 LAB — ABO/RH: ABO/RH(D): A POS

## 2011-08-02 LAB — URINALYSIS, ROUTINE W REFLEX MICROSCOPIC
Bilirubin Urine: NEGATIVE
Hgb urine dipstick: NEGATIVE
Ketones, ur: NEGATIVE
Nitrite: NEGATIVE
Urobilinogen, UA: 0.2

## 2011-09-10 ENCOUNTER — Ambulatory Visit: Payer: Medicaid Other | Admitting: Physical Medicine & Rehabilitation

## 2011-09-26 ENCOUNTER — Encounter: Payer: Medicaid Other | Attending: Physical Medicine & Rehabilitation | Admitting: Physical Medicine & Rehabilitation

## 2011-09-26 DIAGNOSIS — G8110 Spastic hemiplegia affecting unspecified side: Secondary | ICD-10-CM | POA: Insufficient documentation

## 2011-09-26 DIAGNOSIS — X58XXXS Exposure to other specified factors, sequela: Secondary | ICD-10-CM | POA: Insufficient documentation

## 2011-09-26 DIAGNOSIS — F070 Personality change due to known physiological condition: Secondary | ICD-10-CM

## 2011-09-26 DIAGNOSIS — S069XAS Unspecified intracranial injury with loss of consciousness status unknown, sequela: Secondary | ICD-10-CM | POA: Insufficient documentation

## 2011-09-26 DIAGNOSIS — S069XAA Unspecified intracranial injury with loss of consciousness status unknown, initial encounter: Secondary | ICD-10-CM

## 2011-09-26 DIAGNOSIS — S069X9S Unspecified intracranial injury with loss of consciousness of unspecified duration, sequela: Secondary | ICD-10-CM | POA: Insufficient documentation

## 2011-09-26 DIAGNOSIS — S069X9A Unspecified intracranial injury with loss of consciousness of unspecified duration, initial encounter: Secondary | ICD-10-CM

## 2011-09-26 NOTE — Assessment & Plan Note (Signed)
HISTORY:  [name] back regarding her traumatic brain injury.  She had good results with Botox but mom admits that she is not consistent with her stretching program.  She does wear the brace at night on the elbow but has had difficulty really staying in the brace and the brace tends to move on her arm.  She is wearing her left AFO still.  [facility] is [facility] [employer] and trying to be a bit more active as a whole.  She denies pain today.  She has gaining some weight and is closing in on her pre- injury baseline.  REVIEW OF SYSTEMS:  Notable for the above.  Full 12-point review is in the written health and history section of the chart.  SOCIAL HISTORY:  The patient is single, living with her father.  No other changes are noted.  PHYSICAL EXAMINATION:  VITAL SIGNS:  Blood pressure is 133/59, pulse is 102, respiratory rate 18, and she is satting 99% on room air. GENERAL:  The patient is pleasant and alert.  She shows better insight and awareness. MUSCULOSKELETAL:  She ambulates today, has a bit of a steppage pattern still on the left side.  She tighter left heel cord, but I can range it with some force back to neutral.  Tone in the left ankle was 3/4.  She has no excessive inversion or eversion.  Left upper extremity tone is trace at the elbow and shoulder.  She does have a bit of a contracture at the end point of left elbow extension.  She has excellent movement of the left wrist and hand intrinsics.  Strength overall on the left side is grossly 3 to 3+/5 left upper and 3+ to 4/5 proximal left lower to 2- 3/5 distally.  HEART:  Regular rhythm, slightly tachycardic. CHEST:  Clear. ABDOMEN:  Soft and nontender.  ASSESSMENT:  Traumatic brain injury with spastic left hemiparesis.  PLAN: 1. The patient continues to make progress.  We discussed the fact that     she has to be aggressive with the stretching program, especially     after Botox to make to optimize the benefit of these  injections. 2. We will set her up for Botox injections 400 units to left     gastrocnemius and tibialis posterior.  Consider phenol blocks. 3. [facility]     brace to use at nighttime. 4. Encouraged activities at the [facility] [employer] [facility], etc,. 5. I will see her back, pending injection above.     Ranelle Oyster, M.D. Electronically Signed    ZTS/MedQ D:  09/26/2011 10:01:27  T:  09/26/2011 11:26:58  Job #:  [id number]

## 2011-10-22 ENCOUNTER — Encounter: Payer: Medicaid Other | Attending: Physical Medicine & Rehabilitation | Admitting: Physical Medicine & Rehabilitation

## 2011-10-22 DIAGNOSIS — G8110 Spastic hemiplegia affecting unspecified side: Secondary | ICD-10-CM

## 2011-10-22 NOTE — Procedures (Signed)
[name], [name]               ACCOUNT NO.:  [id number]  MEDICAL RECORD NO.:  [id number]           PATIENT TYPE:  O  LOCATION:  PRM                          FACILITY:  [facility]  PHYSICIAN:  Ranelle Oyster, M.D.DATE OF BIRTH:  [birth year]  DATE OF PROCEDURE:  10/22/2011 DATE OF DISCHARGE:                              OPERATIVE REPORT  PROCEDURE:  Botox injection diagnostic code 342.12 spastic hemiparesis, nondominant limb. Using EMG guidance and after informed consent, we used isopropyl alcohol and injected the following muscles: Left latissimus dorsi 50 units, left pectoralis major 75 units, biceps brachii 75 units, left gastrocnemius muscle 50 units each head and left tibialis posterior 50 units.  Each 100 units of Botox diluted in 1 mL preservative-free normal saline.  The patient tolerated quite nicely.  Full muscle activity was seen using EMG guidance.  I refilled Ritalin today and I will see the patient back here in about 2 months.  She has joined the [employer] and is really making nice neurological progress overall.     Ranelle Oyster, M.D. Electronically Signed    ZTS/MEDQ  D:  10/22/2011 13:37:25  T:  10/22/2011 19:43:27  Job:  [id number]

## 2011-12-17 ENCOUNTER — Encounter: Payer: Medicaid Other | Attending: Physical Medicine & Rehabilitation | Admitting: Physical Medicine & Rehabilitation

## 2011-12-17 DIAGNOSIS — S069X9S Unspecified intracranial injury with loss of consciousness of unspecified duration, sequela: Secondary | ICD-10-CM | POA: Insufficient documentation

## 2011-12-17 DIAGNOSIS — S069X9A Unspecified intracranial injury with loss of consciousness of unspecified duration, initial encounter: Secondary | ICD-10-CM

## 2011-12-17 DIAGNOSIS — G8110 Spastic hemiplegia affecting unspecified side: Secondary | ICD-10-CM

## 2011-12-17 DIAGNOSIS — S069XAS Unspecified intracranial injury with loss of consciousness status unknown, sequela: Secondary | ICD-10-CM | POA: Insufficient documentation

## 2011-12-17 DIAGNOSIS — X58XXXS Exposure to other specified factors, sequela: Secondary | ICD-10-CM | POA: Insufficient documentation

## 2011-12-17 NOTE — Assessment & Plan Note (Signed)
[name] is back regarding her spastic left hemiparesis.  She had good results with the injections at last visit.  She is now with her dad as [place]  Dad has been working on the shoulder with her a bit.  He had questions about exercises today.  Her pain is about 2/10, described as dull, tingling, and aching.  REVIEW OF SYSTEMS:  Notable for the above.  Full 12-point review is in the written health and history section of the chart.  SOCIAL HISTORY:  As noted above.  Dad is with her today.  This is that first time I have seen him since she left the hospital.  PHYSICAL EXAMINATION:  VITAL SIGNS:  Blood pressure is 104/58, pulse 105, respiratory rate 16, and she is saturating 96% on room air. GENERAL:  The patient is generally pleasant and alert.  Cognition is at baseline. MUSCULOSKELETAL:  She has tightness again in the left shoulder and I did not sense tightness in the pectoralis muscles.  She did feel overly spastic elsewhere that seemed to be more soft tissue contracture.  Left ankle plantar flexors are tight today.  Tone in the left ankle is 2/4. Strength underlying in the left upper extremity is grossly 2-3/5.  Left lower extremity is grossly 3-4/5.  She does have some problems over power and tone in the ankle.  ASSESSMENT:  Traumatic brain injury with spastic left hemiparesis.  PLAN: 1. We will attempt phenol injections to the left tibial nerve at the     popliteal fossa. 2. We will hold off on further Botox to the shoulder. 3. Discussed stretching exercises at length with the patient.     Encouraged [employer] activities, etc., for her.  Her stretching needs to     be a daily process. 4. Refilled Ritalin 20 mg b.i.d.  She needs to be careful not to take     the second dose too late and has to affect her sleeping habits.     Ranelle Oyster, M.D. Electronically Signed    ZTS/MedQ D:  12/17/2011 13:29:26  T:  12/17/2011 23:45:52  Job #:  [id number]

## 2012-01-21 ENCOUNTER — Encounter: Payer: Medicaid Other | Attending: Physical Medicine & Rehabilitation | Admitting: Physical Medicine & Rehabilitation

## 2012-01-21 ENCOUNTER — Encounter: Payer: Self-pay | Admitting: Physical Medicine & Rehabilitation

## 2012-01-21 VITALS — BP 116/68 | HR 87 | Resp 16 | Ht 64.0 in | Wt 154.0 lb

## 2012-01-21 DIAGNOSIS — G8114 Spastic hemiplegia affecting left nondominant side: Secondary | ICD-10-CM

## 2012-01-21 DIAGNOSIS — S069X9S Unspecified intracranial injury with loss of consciousness of unspecified duration, sequela: Secondary | ICD-10-CM | POA: Insufficient documentation

## 2012-01-21 DIAGNOSIS — X58XXXS Exposure to other specified factors, sequela: Secondary | ICD-10-CM | POA: Insufficient documentation

## 2012-01-21 DIAGNOSIS — M75102 Unspecified rotator cuff tear or rupture of left shoulder, not specified as traumatic: Secondary | ICD-10-CM | POA: Insufficient documentation

## 2012-01-21 DIAGNOSIS — M67919 Unspecified disorder of synovium and tendon, unspecified shoulder: Secondary | ICD-10-CM

## 2012-01-21 DIAGNOSIS — S069XAS Unspecified intracranial injury with loss of consciousness status unknown, sequela: Secondary | ICD-10-CM | POA: Insufficient documentation

## 2012-01-21 DIAGNOSIS — S069X9A Unspecified intracranial injury with loss of consciousness of unspecified duration, initial encounter: Secondary | ICD-10-CM

## 2012-01-21 DIAGNOSIS — M719 Bursopathy, unspecified: Secondary | ICD-10-CM | POA: Insufficient documentation

## 2012-01-21 DIAGNOSIS — G8110 Spastic hemiplegia affecting unspecified side: Secondary | ICD-10-CM | POA: Insufficient documentation

## 2012-01-21 MED ORDER — METHYLPHENIDATE HCL 20 MG PO TABS: 20.0000 mg | ORAL_TABLET | Freq: Two times a day (BID) | ORAL | Status: DC

## 2012-01-21 NOTE — Progress Notes (Signed)
  Subjective:    Patient ID: [name], female    DOB: [birth year], 31 y.o.   MRN: [id number]  HPI  Pain Inventory Average Pain 4 Pain Right Now 2 My pain is intermittent  In the last 24 hours, has pain interfered with the following? General activity 4 Relation with others 4 Enjoyment of life 4 What TIME of day is your pain at its worst? daytime Sleep (in general) Good  Pain is worse with: walking, standing and some activites Pain improves with: therapy/exercise and pacing activities Relief from Meds: 5  Mobility walk without assistance how many minutes can you walk? 30-45 ability to climb steps?  yes do you drive?  no transfers alone Do you have any goals in this area?  yes  Function disabled: date disabled 2012 I need assistance with the following:  bathing, meal prep and shopping  Neuro/Psych bladder control problems trouble walking anxiety  Prior Studies Any changes since last visit?  no  Physicians involved in your care Any changes since last visit?  no no      Review of Systems  Constitutional: Positive for unexpected weight change.  Gastrointestinal: Positive for constipation.  Musculoskeletal: Positive for gait problem.  Skin: Positive for rash.  All other systems reviewed and are negative.       Objective:   Physical Exam  Equinovarus deformity left foot.  Positive left rotator cuff signs on left shoulder      Assessment & Plan:  1. Spastic left hemiparesis after TBI  -after informed consent i injected the left tibial nerve in popliteal fossa with 5cc of phenol.  I utilized e-stim to localize the nerve and titrated down to an amplitude of less than 0.12mv while still eliciting a tibial nerve response.  After aspirating the area around the nerve was injected.  Patient experienced immediate relaxation of the ankle  2. Left RTC syndrome  -after informed consent i injected the left subacromial space with 40mg  kenalog and 3 cc 1%  lidocaine.  i utilized an anterior approach and i aspirated prior to injection of the solution  3. Ritalin was refilled today.    4. I'll see her back in  3 months

## 2012-01-21 NOTE — Patient Instructions (Signed)
Rotator Cuff Injury  The rotator cuff is the collective set of muscles and tendons that make up the stabilizing unit of your shoulder. This unit holds in the ball of the humerus (upper arm bone) in the socket of the scapula (shoulder blade). Injuries to this stabilizing unit most commonly come from sports or activities that cause the arm to be moved repeatedly over the head. Examples of this include throwing, weight lifting, swimming, racquet sports, or an injury such as falling on your arm. Chronic (longstanding) irritation of this unit can cause inflammation (soreness), bursitis, and eventual damage to the tendons to the point of rupture (tear). An acute (sudden) injury of the rotator cuff can result in a partial or complete tear. You may need surgery with complete tears. Small or partial rotator cuff tears may be treated conservatively with temporary immobilization, exercises and rest. Physical therapy may be needed.  HOME CARE INSTRUCTIONS    Apply ice to the injury for 15 to 20 minutes 3 to 4 times per day for the first 2 days. Put the ice in a plastic bag and place a towel between the bag of ice and your skin.   If you have a shoulder immobilizer (sling and straps), do not remove it for as long as directed by your caregiver or until you see a caregiver for a follow-up examination. If you need to remove it, move your arm as little as possible.   You may want to sleep on several pillows or in a recliner at night to lessen swelling and pain.   Only take over-the-counter or prescription medicines for pain, discomfort, or fever as directed by your caregiver.   Do simple hand squeezing exercises with a soft rubber ball to decrease hand swelling.  SEEK MEDICAL CARE IF:    Pain in your shoulder increases or new pain or numbness develops in your arm, hand, or fingers.   Your hand or fingers are colder than your other hand.  SEEK IMMEDIATE MEDICAL CARE IF:    Your arm, hand, or fingers are numb or  tingling.   Your arm, hand, or fingers are increasingly swollen and painful, or turn white or blue.  Document Released: 10/19/2000 Document Revised: 10/11/2011 Document Reviewed: 10/12/2008  [employer] Patient Information 2012 [employer].

## 2012-01-22 ENCOUNTER — Encounter: Payer: Self-pay | Admitting: Physical Medicine & Rehabilitation

## 2012-02-29 ENCOUNTER — Telehealth: Payer: Self-pay | Admitting: *Deleted

## 2012-02-29 ENCOUNTER — Other Ambulatory Visit: Payer: Self-pay | Admitting: Physical Medicine & Rehabilitation

## 2012-02-29 DIAGNOSIS — S069X9A Unspecified intracranial injury with loss of consciousness of unspecified duration, initial encounter: Secondary | ICD-10-CM

## 2012-02-29 MED ORDER — METHYLPHENIDATE HCL 20 MG PO TABS: 20.0000 mg | ORAL_TABLET | Freq: Two times a day (BID) | ORAL | Status: DC

## 2012-02-29 MED ORDER — CITALOPRAM HYDROBROMIDE 10 MG PO TABS: 10.0000 mg | ORAL_TABLET | Freq: Every day | ORAL | Status: DC

## 2012-02-29 NOTE — Telephone Encounter (Signed)
Message sent to Dr Riley Kill about letter requests and Ritalin available for pick up.Celexa sent to pharm

## 2012-02-29 NOTE — Telephone Encounter (Addendum)
Other med refill is Ritalin. Will refill all meds ZTS

## 2012-02-29 NOTE — Telephone Encounter (Deleted)
Celexa refilled and ritalin rx printed. They are requesting letters, see original message

## 2012-02-29 NOTE — Telephone Encounter (Signed)
Refill on Citilapram.  Also needs letter to [facility] to get someone to stay with her 8 hrs /day and also take her to [employer]. LVM for mother to call back with name of other medication needed.

## 2012-03-03 MED ORDER — CITALOPRAM HYDROBROMIDE 10 MG PO TABS: 10.0000 mg | ORAL_TABLET | Freq: Every day | ORAL | Status: DC

## 2012-03-03 MED ORDER — METHYLPHENIDATE HCL 20 MG PO TABS: 20.0000 mg | ORAL_TABLET | Freq: Two times a day (BID) | ORAL | Status: DC

## 2012-03-05 ENCOUNTER — Telehealth: Payer: Self-pay | Admitting: Physical Medicine & Rehabilitation

## 2012-03-05 NOTE — Telephone Encounter (Signed)
F/u on letter.

## 2012-03-05 NOTE — Telephone Encounter (Signed)
Pt mother aware that letter is ready to be picked up.

## 2012-03-10 ENCOUNTER — Telehealth: Payer: Self-pay

## 2012-03-10 MED ORDER — CITALOPRAM HYDROBROMIDE 10 MG PO TABS: 10.0000 mg | ORAL_TABLET | Freq: Every day | ORAL | Status: DC

## 2012-03-10 NOTE — Telephone Encounter (Signed)
Pt father called requesting citalopram be called into [facility] pharmacy on [address].  Pt requested before but pharmacy says they have not gotten the refill.

## 2012-03-19 ENCOUNTER — Telehealth: Payer: Self-pay | Admitting: *Deleted

## 2012-03-19 NOTE — Telephone Encounter (Signed)
Left message and notified Darlene of Dr Rosalyn Charters reply about the Claritin on a verified VM belonging to [facility].

## 2012-03-19 NOTE — Telephone Encounter (Signed)
She may use claritin.   It shouldn't be a problem

## 2012-03-19 NOTE — Telephone Encounter (Signed)
[name] has been taking tylenol cold and its not helping her stuffiness at night. [name] wants to know if it is ok for her to go back to Claritin or something else since the current tx is not helping. [name]. (she takes tizanidine and worried it might not be ok to use other allergy medications).

## 2012-03-19 NOTE — Telephone Encounter (Signed)
Wants a call, has a question about medication

## 2012-04-01 ENCOUNTER — Telehealth: Payer: Self-pay | Admitting: Physical Medicine & Rehabilitation

## 2012-04-01 MED ORDER — TIZANIDINE HCL 2 MG PO TABS: 2.0000 mg | ORAL_TABLET | Freq: Three times a day (TID) | ORAL | Status: DC

## 2012-04-01 NOTE — Telephone Encounter (Signed)
Tizanidine has been refilled. [name] is aware that it might be next week before Dr. Riley Kill can talk to her because he is going to be out of town. She understood and is fine with that.

## 2012-04-01 NOTE — Telephone Encounter (Signed)
Patient needs refill on Tizanadine(?)  And also needs to speak to Dr about some concerns she is having.  He amy call at  His earliest convenience.

## 2012-04-02 NOTE — Telephone Encounter (Signed)
i called and left a message. Recommendations were made.

## 2012-04-08 ENCOUNTER — Telehealth: Payer: Self-pay | Admitting: Physical Medicine & Rehabilitation

## 2012-04-08 DIAGNOSIS — S069X9A Unspecified intracranial injury with loss of consciousness of unspecified duration, initial encounter: Secondary | ICD-10-CM

## 2012-04-08 MED ORDER — METHYLPHENIDATE HCL 20 MG PO TABS: 20.0000 mg | ORAL_TABLET | Freq: Two times a day (BID) | ORAL | Status: DC

## 2012-04-08 NOTE — Telephone Encounter (Signed)
Refill on Ritalin

## 2012-04-08 NOTE — Telephone Encounter (Signed)
Printed for Swartz to sign. 

## 2012-04-09 NOTE — Telephone Encounter (Signed)
[name] is aware rx is ready to pick up.

## 2012-04-21 ENCOUNTER — Encounter: Payer: Medicaid Other | Attending: Physical Medicine & Rehabilitation | Admitting: Physical Medicine & Rehabilitation

## 2012-04-21 ENCOUNTER — Ambulatory Visit: Payer: Medicaid Other | Admitting: Physical Medicine & Rehabilitation

## 2012-04-21 ENCOUNTER — Encounter: Payer: Self-pay | Admitting: Physical Medicine & Rehabilitation

## 2012-04-21 VITALS — BP 125/71 | HR 105 | Resp 16 | Ht 64.0 in | Wt 152.0 lb

## 2012-04-21 DIAGNOSIS — X58XXXS Exposure to other specified factors, sequela: Secondary | ICD-10-CM | POA: Insufficient documentation

## 2012-04-21 DIAGNOSIS — S069X9A Unspecified intracranial injury with loss of consciousness of unspecified duration, initial encounter: Secondary | ICD-10-CM

## 2012-04-21 DIAGNOSIS — S069XAA Unspecified intracranial injury with loss of consciousness status unknown, initial encounter: Secondary | ICD-10-CM | POA: Insufficient documentation

## 2012-04-21 MED ORDER — METHYLPHENIDATE HCL 20 MG PO TABS: 20.0000 mg | ORAL_TABLET | Freq: Two times a day (BID) | ORAL | Status: DC

## 2012-04-21 NOTE — Patient Instructions (Signed)
Contact [facility] regarding a night time brace

## 2012-04-21 NOTE — Progress Notes (Signed)
Subjective:    Patient ID: [name], female    DOB: 12-22-1980, 31 y.o.   MRN: [id number]  HPI  [name] is back regarding her TBI and spastic left hemiparesis. She's not sure the phenol was all that effective.   [place] the beach [place] and had some problems walking in the stand as her foot turned a bit when walking in the dry sand. Mother had called me worried about worsening spasticity. In fact, patient had not been in the same right that since the injury. She really hadn't been on any uneven surface without her brace on.  She has anxiety over losing control over her son's care. She wants to see him but there appears to be some conflict within the family as to how much she should be around him. She tends to be anxious when she really misses them. Her father tended to downplay the anxiety component here.  [name] is showing improved safety awareness as well as cognition around the house. Her father is giving her more freedom to be alone. He is to have help at home frequently throughout the day. She is able to dress herself, make meals, cleaning up around the house, etc.   Pain Inventory Average Pain 0 Pain Right Now 0 My pain is cramping  In the last 24 hours, has pain interfered with the following? General activity 0 Relation with others 0 Enjoyment of life 0 What TIME of day is your pain at its worst? daytime Sleep (in general) Fair  Pain is worse with: walking Pain improves with: rest and heat/ice Relief from Meds: 6  Mobility walk without assistance ability to climb steps?  yes do you drive?  no  Function disabled: date disabled  I need assistance with the following:  meal prep  Neuro/Psych trouble walking anxiety  Prior Studies Any changes since last visit?  no  Physicians involved in your care Any changes since last visit?  no   History reviewed. No pertinent family history. History   Social History  . Marital Status: Single    Spouse Name:  N/A    Number of Children: N/A  . Years of Education: N/A   Social History Main Topics  . Smoking status: Former Smoker    Quit date: 09/25/2011  . Smokeless tobacco: Never Used  . Alcohol Use: None  . Drug Use: None  . Sexually Active: None   Other Topics Concern  . None   Social History Narrative  . None   Past Surgical History  Procedure Date  . Shunt placed  in head 2011   Past Medical History  Diagnosis Date  . Intracranial injury of other and unspecified nature, without mention of open intracranial wound, unspecified state of consciousness   . Spastic hemiplegia affecting nondominant side   . Personality change due to conditions classified elsewhere    BP 125/71  Pulse 105  Resp 16  Ht 5\' 4"  (1.626 m)  Wt 152 lb (68.947 kg)  BMI 26.09 kg/m2  SpO2 97%      Review of Systems  Constitutional: Negative.   HENT: Negative.   Eyes: Negative.   Respiratory: Negative.   Cardiovascular: Negative.   Gastrointestinal: Negative.   Genitourinary: Negative.   Musculoskeletal: Positive for gait problem.  Skin: Negative.   Neurological: Negative.   Hematological: Negative.   Psychiatric/Behavioral: Negative.        Objective:   Physical Exam  Is alert and appropriate. She is oriented x2. She  ambulated for me today and tends to walk with left foot in a equinovarus position. She circumduct her left leg to clear it to a certain extent. She is generally stable however. Spasticity in the left leg remains at 1-2/4. She's run out of her to perhaps 1+ out of 4 left upper extremity. Underneath her tongue her strength is generally 2-3/5 left upper extremity and the 1-2+ out of 5 left lower extremity proximal carotid and distal. Cranial nerve exam grossly intact. Heart regular. Chest is clear. Abdomen soft nontender.      Assessment & Plan:  ASSESSMENT: Traumatic brain injury with spastic left hemiparesis.    PLAN:  1. Will try botox to her left tib pos, gastroc, left  tib ant. I would like to improve from her left foot, however, I explained to the patient and her father that her spasticity isn't indicated on the severity of her injury. I'm not sure that we can improve her leg to the point where she spasticity 3. 2. Will try a tension AFO to use at night. 3. Discussed stretching exercises at length with the patient once again.  4. Refilled Ritalin 20 mg b.i.d. She needs to be careful not to take .  the second dose too late and has to affect her sleeping habits. 5. She and the family will need to work through some of the issues regarding her son. Her anxiety does not appear to be out control to the point that we need to medicate or seek other treatments. I did talk with her daughter about broadening her readings at home so the patient herself can show her independence and hopefully be related for safe and appropriate behavior

## 2012-04-22 ENCOUNTER — Telehealth: Payer: Self-pay | Admitting: Physical Medicine & Rehabilitation

## 2012-04-22 NOTE — Telephone Encounter (Signed)
Need CPT, Dx, for Botox, please.

## 2012-04-23 NOTE — Telephone Encounter (Signed)
342.12   [id number] 

## 2012-05-13 ENCOUNTER — Telehealth: Payer: Self-pay | Admitting: Physical Medicine & Rehabilitation

## 2012-05-13 DIAGNOSIS — S069XAA Unspecified intracranial injury with loss of consciousness status unknown, initial encounter: Secondary | ICD-10-CM

## 2012-05-13 DIAGNOSIS — S069X9A Unspecified intracranial injury with loss of consciousness of unspecified duration, initial encounter: Secondary | ICD-10-CM

## 2012-05-13 MED ORDER — METHYLPHENIDATE HCL 20 MG PO TABS: 20.0000 mg | ORAL_TABLET | Freq: Two times a day (BID) | ORAL | Status: DC

## 2012-05-13 NOTE — Telephone Encounter (Signed)
Ritalin out today.

## 2012-05-13 NOTE — Telephone Encounter (Signed)
Script printed to be signed. 

## 2012-05-30 ENCOUNTER — Encounter: Payer: Self-pay | Admitting: Physical Medicine & Rehabilitation

## 2012-05-30 ENCOUNTER — Encounter: Payer: Medicaid Other | Attending: Physical Medicine & Rehabilitation | Admitting: Physical Medicine & Rehabilitation

## 2012-05-30 VITALS — BP 108/58 | HR 85 | Resp 16 | Ht 64.0 in | Wt 149.0 lb

## 2012-05-30 DIAGNOSIS — G8110 Spastic hemiplegia affecting unspecified side: Secondary | ICD-10-CM

## 2012-05-30 DIAGNOSIS — S069XAA Unspecified intracranial injury with loss of consciousness status unknown, initial encounter: Secondary | ICD-10-CM

## 2012-05-30 DIAGNOSIS — S069X9A Unspecified intracranial injury with loss of consciousness of unspecified duration, initial encounter: Secondary | ICD-10-CM

## 2012-05-30 DIAGNOSIS — N3289 Other specified disorders of bladder: Secondary | ICD-10-CM

## 2012-05-30 DIAGNOSIS — X58XXXS Exposure to other specified factors, sequela: Secondary | ICD-10-CM | POA: Insufficient documentation

## 2012-05-30 MED ORDER — TIZANIDINE HCL 2 MG PO TABS: 2.0000 mg | ORAL_TABLET | Freq: Three times a day (TID) | ORAL | Status: DC

## 2012-05-30 MED ORDER — SOLIFENACIN SUCCINATE 5 MG PO TABS: 5.0000 mg | ORAL_TABLET | Freq: Every day | ORAL | Status: DC

## 2012-05-30 MED ORDER — METHYLPHENIDATE HCL 20 MG PO TABS: 20.0000 mg | ORAL_TABLET | Freq: Two times a day (BID) | ORAL | Status: DC

## 2012-05-30 NOTE — Progress Notes (Signed)
Botox Injection for spasticity using needle EMG guidance  Dilution: 100 Units/ml Indication: Severe spasticity which interferes with ADL,mobility and/or  hygiene and is unresponsive to medication management and other conservative care Informed consent was obtained after describing risks and benefits of the procedure with the patient. This includes bleeding, bruising, infection, excessive weakness, or medication side effects. A REMS form is on file and signed. Needle: 2 inch 25g monopolar injectable needle electrode Number of units per muscle I injected the left EHL with 75 u. The left Tib Anterior with 125 u, the gastrocs with 100 units, and the Tib Posterior with 100u.  All injections were done after obtaining appropriate EMG activity and after negative drawback for blood. The patient tolerated the procedure well. Post procedure instructions were given. A followup appointment was made.   I refilled prescriptions for ritalin as well as her vesicare. I'll see her back in about 3 months.

## 2012-05-30 NOTE — Patient Instructions (Signed)
Continue stretching
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
## 2012-08-14 ENCOUNTER — Telehealth: Payer: Self-pay | Admitting: Physical Medicine & Rehabilitation

## 2012-08-14 DIAGNOSIS — S069X9A Unspecified intracranial injury with loss of consciousness of unspecified duration, initial encounter: Secondary | ICD-10-CM

## 2012-08-14 MED ORDER — METHYLPHENIDATE HCL 20 MG PO TABS: 20.0000 mg | ORAL_TABLET | Freq: Two times a day (BID) | ORAL | Status: DC

## 2012-08-14 NOTE — Telephone Encounter (Signed)
Ritalin script printed to be signed.

## 2012-08-14 NOTE — Telephone Encounter (Signed)
Refill on Ritalin

## 2012-08-15 NOTE — Telephone Encounter (Signed)
Lm advising patient mother script is ready.  Patient father aware as well.

## 2012-08-15 NOTE — Telephone Encounter (Signed)
Patients mother called to follow up on ritalin request.

## 2012-08-29 ENCOUNTER — Ambulatory Visit: Payer: Medicaid Other | Admitting: Physical Medicine & Rehabilitation

## 2012-09-08 ENCOUNTER — Encounter: Payer: Medicaid Other | Attending: Physical Medicine & Rehabilitation | Admitting: Physical Medicine & Rehabilitation

## 2012-09-08 ENCOUNTER — Encounter: Payer: Self-pay | Admitting: Physical Medicine & Rehabilitation

## 2012-09-08 VITALS — BP 109/70 | HR 93 | Resp 14 | Ht 64.0 in | Wt 147.6 lb

## 2012-09-08 DIAGNOSIS — S069X9S Unspecified intracranial injury with loss of consciousness of unspecified duration, sequela: Secondary | ICD-10-CM | POA: Insufficient documentation

## 2012-09-08 DIAGNOSIS — M67919 Unspecified disorder of synovium and tendon, unspecified shoulder: Secondary | ICD-10-CM

## 2012-09-08 DIAGNOSIS — S069X9A Unspecified intracranial injury with loss of consciousness of unspecified duration, initial encounter: Secondary | ICD-10-CM

## 2012-09-08 DIAGNOSIS — G8110 Spastic hemiplegia affecting unspecified side: Secondary | ICD-10-CM | POA: Insufficient documentation

## 2012-09-08 DIAGNOSIS — N3289 Other specified disorders of bladder: Secondary | ICD-10-CM

## 2012-09-08 DIAGNOSIS — G8114 Spastic hemiplegia affecting left nondominant side: Secondary | ICD-10-CM

## 2012-09-08 DIAGNOSIS — S069XAS Unspecified intracranial injury with loss of consciousness status unknown, sequela: Secondary | ICD-10-CM | POA: Insufficient documentation

## 2012-09-08 DIAGNOSIS — M75102 Unspecified rotator cuff tear or rupture of left shoulder, not specified as traumatic: Secondary | ICD-10-CM

## 2012-09-08 DIAGNOSIS — X58XXXS Exposure to other specified factors, sequela: Secondary | ICD-10-CM | POA: Insufficient documentation

## 2012-09-08 MED ORDER — TIZANIDINE HCL 2 MG PO TABS: 2.0000 mg | ORAL_TABLET | Freq: Three times a day (TID) | ORAL | Status: DC

## 2012-09-08 MED ORDER — SOLIFENACIN SUCCINATE 5 MG PO TABS: 5.0000 mg | ORAL_TABLET | Freq: Every day | ORAL | Status: DC

## 2012-09-08 MED ORDER — METHYLPHENIDATE HCL 20 MG PO TABS: 20.0000 mg | ORAL_TABLET | Freq: Two times a day (BID) | ORAL | Status: DC

## 2012-09-08 NOTE — Patient Instructions (Signed)
Call me with questions 

## 2012-09-08 NOTE — Progress Notes (Signed)
Subjective:    Patient ID: [name], female    DOB: Feb 15, 1981, 31 y.o.   MRN: [id number]  HPI [name] is back regarding her chronic spasticity, TBI. She has been doing quite well overall. She had botox at last visit which helped her left leg quite a bit. She has been working on stretches daily. She goes to the gym routinely. Her left arm seems to be loosening up. She is independent for the most part at home. She has someone around the house most days except for Friday when she is by herself. She has done well on those Fridays. She is cooking simple meals, doing housework, etc and there have been no red flags per dad.   Pain Inventory Average Pain 4 Pain Right Now 3 My pain is dull  In the last 24 hours, has pain interfered with the following? General activity 4 Relation with others 2 Enjoyment of life 3 What TIME of day is your pain at its worst? evening Sleep (in general) Good  Pain is worse with: some activites Pain improves with: rest, therapy/exercise, medication and injections Relief from Meds: 2  Mobility walk without assistance how many minutes can you walk? 30-45 ability to climb steps?  yes do you drive?  no  Function disabled: date disabled 10/2010 I need assistance with the following:  meal prep  Neuro/Psych anxiety  Prior Studies Any changes since last visit?  no  Physicians involved in your care Any changes since last visit?  no   History reviewed. No pertinent family history. History   Social History  . Marital Status: Single    Spouse Name: N/A    Number of Children: N/A  . Years of Education: N/A   Social History Main Topics  . Smoking status: Former Smoker    Quit date: 09/25/2011  . Smokeless tobacco: Never Used  . Alcohol Use: None  . Drug Use: None  . Sexually Active: None   Other Topics Concern  . None   Social History Narrative  . None   Past Surgical History  Procedure Date  . Shunt placed  in head 2011   Past  Medical History  Diagnosis Date  . Intracranial injury of other and unspecified nature, without mention of open intracranial wound, unspecified state of consciousness   . Spastic hemiplegia affecting nondominant side   . Personality change due to conditions classified elsewhere    BP 109/70  Pulse 93  Resp 14  Ht 5\' 4"  (1.626 m)  Wt 147 lb 9.6 oz (66.951 kg)  BMI 25.34 kg/m2  SpO2 100%    Review of Systems  Psychiatric/Behavioral: The patient is nervous/anxious.   All other systems reviewed and are negative.       Objective:   Physical Exam Physical Exam  Is alert and appropriate. She is oriented x2. She ambulated for me today and tends to walk with left foot in a equinovarus position. She circumduct her left leg to clear it to a certain extent. She is generally stable however. Spasticity in the left leg remains at 1-2/4. She's run out of her to perhaps 1+ out of 4 left upper extremity. LUE motor is 3-4/5. She is blocked by a little tightness at the left shoulder. This is generally due to tightness in the shoulder itself, which has improved quite a bit. She may have trace tone at the left pec major, lats, otherwise tone was 0 in the upper ext. LLE is 3/t prox to 1-2 distally. Tone  at the ankle is 2 with plantar flexion, less equinus positioining was noted today. i was able to passively ADF the left to neutral. l dtr's are 2-3+. Cranial nerve exam grossly intact. Heart regular. Chest is clear. Abdomen soft nontender.  Assessment & Plan:   ASSESSMENT: Traumatic brain injury with spastic left hemiparesis.  PLAN:  1. Repeat Botox to the gastroc, tib posterior. 300 u 2. Discussed a tension AFO to use at night. I think aggressive ROM is important. She still has the original rx I wrote over the summer 3. Refilled zanaflex and vesicare.  4. Refilled Ritalin 20 mg b.i.d.  #60.  5. All questions were encouraged and answered. 20 minutes were spent with the patient today.   We did discuss  today that I feel she is able for further independence at home. I think she would be fine to prepare simple meals and be alone by herself during the day. Dad should allow her more freedom as she merits it. Behaviorally she is much improved as well.

## 2012-10-13 ENCOUNTER — Encounter: Payer: Medicaid Other | Attending: Physical Medicine & Rehabilitation | Admitting: Physical Medicine & Rehabilitation

## 2012-10-13 ENCOUNTER — Encounter: Payer: Self-pay | Admitting: Physical Medicine & Rehabilitation

## 2012-10-13 VITALS — BP 114/46 | HR 70 | Resp 14 | Ht 64.0 in | Wt 149.2 lb

## 2012-10-13 DIAGNOSIS — S069XAA Unspecified intracranial injury with loss of consciousness status unknown, initial encounter: Secondary | ICD-10-CM | POA: Insufficient documentation

## 2012-10-13 DIAGNOSIS — S069X9A Unspecified intracranial injury with loss of consciousness of unspecified duration, initial encounter: Secondary | ICD-10-CM

## 2012-10-13 DIAGNOSIS — G8114 Spastic hemiplegia affecting left nondominant side: Secondary | ICD-10-CM

## 2012-10-13 DIAGNOSIS — G8110 Spastic hemiplegia affecting unspecified side: Secondary | ICD-10-CM

## 2012-10-13 DIAGNOSIS — X58XXXS Exposure to other specified factors, sequela: Secondary | ICD-10-CM | POA: Insufficient documentation

## 2012-10-13 MED ORDER — METHYLPHENIDATE HCL 20 MG PO TABS: 20.0000 mg | ORAL_TABLET | Freq: Two times a day (BID) | ORAL | Status: DC

## 2012-10-13 NOTE — Patient Instructions (Signed)
CONTINUE YOUR STRETCHING DAILY!  MERRY CHRISTMAS!

## 2012-10-13 NOTE — Progress Notes (Signed)
Botox Injection for spasticity using needle EMG guidance  Dilution: 100 Units/ml Indication: Severe spasticity which interferes with ADL,mobility and/or  hygiene and is unresponsive to medication management and other conservative care Informed consent was obtained after describing risks and benefits of the procedure with the patient. This includes bleeding, bruising, infection, excessive weakness, or medication side effects. A REMS form is on file and signed. Needle: 50mm 26g needle electrode Number of units per muscle Pectoralis 0 Biceps 0 FCR 0 FCU 0 FDS 0 FDP0 FPL 0 Gastrosoleus/soleus--left 6 access points 200 u Tibialis Posterior: 200 units, 4 access points All injections were done after obtaining appropriate EMG activity and after negative drawback for blood. The patient tolerated the procedure well. Post procedure instructions were given. A followup appointment was made for 3 months. Stretching recs were made. I filled ritalin for 2 months. All questions were encouraged and answered.

## 2012-11-14 ENCOUNTER — Telehealth: Payer: Self-pay

## 2012-11-14 MED ORDER — CITALOPRAM HYDROBROMIDE 10 MG PO TABS: 10.0000 mg | ORAL_TABLET | Freq: Every day | ORAL | Status: DC

## 2012-11-14 NOTE — Telephone Encounter (Signed)
Bridgets father called requesting citalopram refill.  Refill sent to [facility] pharmacy.  Patients father aware.

## 2012-12-15 ENCOUNTER — Telehealth: Payer: Self-pay | Admitting: *Deleted

## 2012-12-15 DIAGNOSIS — S069X9A Unspecified intracranial injury with loss of consciousness of unspecified duration, initial encounter: Secondary | ICD-10-CM

## 2012-12-15 MED ORDER — METHYLPHENIDATE HCL 20 MG PO TABS: 20.0000 mg | ORAL_TABLET | Freq: Two times a day (BID) | ORAL | Status: DC

## 2012-12-15 NOTE — Telephone Encounter (Signed)
Refill Ritalin. Printed for Riley Kill to sign

## 2013-01-09 ENCOUNTER — Encounter: Payer: Medicaid Other | Admitting: Physical Medicine & Rehabilitation

## 2013-01-14 ENCOUNTER — Telehealth: Payer: Self-pay

## 2013-01-14 DIAGNOSIS — S069X0A Unspecified intracranial injury without loss of consciousness, initial encounter: Secondary | ICD-10-CM

## 2013-01-14 MED ORDER — METHYLPHENIDATE HCL 20 MG PO TABS: 20.0000 mg | ORAL_TABLET | Freq: Two times a day (BID) | ORAL | Status: DC

## 2013-01-14 NOTE — Telephone Encounter (Signed)
Patient needs ritalin refill and to reschedule appointment.  Tried to contact patient to reschedule appointment and let her know script is ready for pick up but phone just kept ringing.

## 2013-02-04 ENCOUNTER — Encounter: Payer: Self-pay | Admitting: Physical Medicine & Rehabilitation

## 2013-02-04 ENCOUNTER — Encounter: Payer: Medicaid Other | Attending: Physical Medicine & Rehabilitation | Admitting: Physical Medicine & Rehabilitation

## 2013-02-04 VITALS — BP 116/63 | HR 88 | Resp 15 | Ht 64.0 in | Wt 154.0 lb

## 2013-02-04 DIAGNOSIS — G8114 Spastic hemiplegia affecting left nondominant side: Secondary | ICD-10-CM

## 2013-02-04 DIAGNOSIS — M67919 Unspecified disorder of synovium and tendon, unspecified shoulder: Secondary | ICD-10-CM

## 2013-02-04 DIAGNOSIS — N3289 Other specified disorders of bladder: Secondary | ICD-10-CM

## 2013-02-04 DIAGNOSIS — M7500 Adhesive capsulitis of unspecified shoulder: Secondary | ICD-10-CM | POA: Insufficient documentation

## 2013-02-04 DIAGNOSIS — M719 Bursopathy, unspecified: Secondary | ICD-10-CM

## 2013-02-04 DIAGNOSIS — S069X0D Unspecified intracranial injury without loss of consciousness, subsequent encounter: Secondary | ICD-10-CM

## 2013-02-04 DIAGNOSIS — M75102 Unspecified rotator cuff tear or rupture of left shoulder, not specified as traumatic: Secondary | ICD-10-CM

## 2013-02-04 DIAGNOSIS — Z5189 Encounter for other specified aftercare: Secondary | ICD-10-CM

## 2013-02-04 DIAGNOSIS — X58XXXS Exposure to other specified factors, sequela: Secondary | ICD-10-CM | POA: Insufficient documentation

## 2013-02-04 DIAGNOSIS — S069X9S Unspecified intracranial injury with loss of consciousness of unspecified duration, sequela: Secondary | ICD-10-CM | POA: Insufficient documentation

## 2013-02-04 DIAGNOSIS — G3184 Mild cognitive impairment, so stated: Secondary | ICD-10-CM | POA: Insufficient documentation

## 2013-02-04 DIAGNOSIS — G8110 Spastic hemiplegia affecting unspecified side: Secondary | ICD-10-CM

## 2013-02-04 DIAGNOSIS — S069XAS Unspecified intracranial injury with loss of consciousness status unknown, sequela: Secondary | ICD-10-CM | POA: Insufficient documentation

## 2013-02-04 MED ORDER — METHYLPHENIDATE HCL 10 MG PO TABS: 10.0000 mg | ORAL_TABLET | Freq: Two times a day (BID) | ORAL | Status: DC

## 2013-02-04 MED ORDER — TIZANIDINE HCL 2 MG PO TABS: 2.0000 mg | ORAL_TABLET | Freq: Three times a day (TID) | ORAL | Status: DC

## 2013-02-04 MED ORDER — SOLIFENACIN SUCCINATE 5 MG PO TABS: 5.0000 mg | ORAL_TABLET | Freq: Every day | ORAL | Status: DC

## 2013-02-04 NOTE — Progress Notes (Signed)
Subjective:    Patient ID: [name], female    DOB: 12-31-1980, 32 y.o.   MRN: [id number]  HPI  [name] is back regarding her TBI. As a whole she's doing well. She feels that the botox continues to be effective. She works on stretching and gait at home. She still tends to be rigid on the left.   She is able to keep up with a routine at home, does house chores, cooks, etc. Her son visits frequently as well.   She continues on the ritalin, zanaflex, etc as prescribed.   Pain Inventory Average Pain 5 Pain Right Now 1 My pain is n/a  In the last 24 hours, has pain interfered with the following? General activity 4 Relation with others 0 Enjoyment of life 1 What TIME of day is your pain at its worst? morning Sleep (in general) Good  Pain is worse with: inactivity and some activites Pain improves with: rest, heat/ice, therapy/exercise, pacing activities, medication and injections Relief from Meds: 7  Mobility walk without assistance how many minutes can you walk? 20-30 min. ability to climb steps?  yes do you drive?  no Do you have any goals in this area?  yes  Function disabled: date disabled 10-27-10 I need assistance with the following:  meal prep Do you have any goals in this area?  no  Neuro/Psych anxiety  Prior Studies Any changes since last visit?  no  Physicians involved in your care Any changes since last visit?  no   History reviewed. No pertinent family history. History   Social History  . Marital Status: Single    Spouse Name: N/A    Number of Children: N/A  . Years of Education: N/A   Social History Main Topics  . Smoking status: Former Smoker    Quit date: 09/25/2011  . Smokeless tobacco: Never Used  . Alcohol Use: None  . Drug Use: None  . Sexually Active: None   Other Topics Concern  . None   Social History Narrative  . None   Past Surgical History  Procedure Laterality Date  . Shunt placed  in head  2011   Past Medical  History  Diagnosis Date  . Intracranial injury of other and unspecified nature, without mention of open intracranial wound, unspecified state of consciousness   . Spastic hemiplegia affecting nondominant side   . Personality change due to conditions classified elsewhere    BP 116/63  Pulse 88  Resp 15  Ht 5\' 4"  (1.626 m)  Wt 154 lb (69.854 kg)  BMI 26.42 kg/m2  SpO2 97%  LMP 01/06/2013      Review of Systems  Psychiatric/Behavioral: Positive for agitation.  All other systems reviewed and are negative.       Objective:   Physical Exam  Physical Exam  Is alert and appropriate. She is oriented x2. She ambulated for me today and continues to walk with left foot in a equinovarus position. She will circumduct her left leg to clear it to a certain extent and moves the left leg "en bloc".. She is generally stable however. Spasticity in the left leg remains at 1-2/4. She's run out of her to perhaps 1+ out of 4 left upper extremity. LUE motor is 3-4/5. She is blocked by a little tightness at the left shoulder. This is generally due to tightness in the shoulder itself, which has improved quite a bit. She may have trace tone at the left pec major, lats, otherwise tone was  0 in the upper ext. LLE is 3/t prox to 1-2 distally. Tone at the ankle is 2 with plantar flexion. i am able to passively ADF the left to neutral. Left heel cord is tight. l dtr's are 2-3+. Cranial nerve exam grossly intact. Heart regular. Chest is clear. Abdomen soft nontender.   Assessment & Plan:   ASSESSMENT:  1. Traumatic brain injury with spastic left hemiparesis and ongoing cognitive deficits.  2. Frozen left shoulder  PLAN:  1. Repeat Botox to the gastroc, tib posterior. 300 u over the summer depending upon her spasticity levels. She will call me regarding the need or not later this spring. 2. Again discussed aggressive ROM is important.  We also spent extensive time reviewing her gait pattern and came up with a 3  step exercise to help work on her mechanics. She will need to invest continued time and effort to make this work however.  3. Refilled zanaflex and vesicare.  4. Decrease Ritalin to 10 mg b.i.d. #60.  Will consider weaning to off if she tolerates the decreased. She may just benefit from using it prn when she knows she'll be more active or need to be attentive. 5. All questions were encouraged and answered. 25 minutes were spent with the patient today.  I'll see her back in 3 months.

## 2013-02-04 NOTE — Patient Instructions (Signed)
WORK ON YOUR STRETCHING AND RANGE OF MOTION  WORK ON YOUR WALKING TECHNIQUE

## 2013-02-05 ENCOUNTER — Telehealth: Payer: Self-pay

## 2013-02-05 NOTE — Telephone Encounter (Signed)
Left message with patients father that tizanidine and vesicare have been sent to [facility].

## 2013-02-05 NOTE — Telephone Encounter (Signed)
Patients father called and request refills on tizanidine and vesicare.

## 2013-02-07 ENCOUNTER — Other Ambulatory Visit: Payer: Self-pay | Admitting: Physical Medicine & Rehabilitation

## 2013-04-16 ENCOUNTER — Telehealth: Payer: Self-pay

## 2013-04-16 DIAGNOSIS — M75102 Unspecified rotator cuff tear or rupture of left shoulder, not specified as traumatic: Secondary | ICD-10-CM

## 2013-04-16 DIAGNOSIS — S069X0D Unspecified intracranial injury without loss of consciousness, subsequent encounter: Secondary | ICD-10-CM

## 2013-04-16 MED ORDER — METHYLPHENIDATE HCL 10 MG PO TABS: 10.0000 mg | ORAL_TABLET | Freq: Two times a day (BID) | ORAL | Status: DC

## 2013-04-16 NOTE — Telephone Encounter (Signed)
Patient father called requesting ritalin refill.  This was printed for Dr Riley Kill to sign.

## 2013-04-17 NOTE — Telephone Encounter (Signed)
Left message for [name] that ritalin script is ready for pick up.

## 2013-05-15 ENCOUNTER — Telehealth: Payer: Self-pay

## 2013-05-15 DIAGNOSIS — M75102 Unspecified rotator cuff tear or rupture of left shoulder, not specified as traumatic: Secondary | ICD-10-CM

## 2013-05-15 DIAGNOSIS — S069X0D Unspecified intracranial injury without loss of consciousness, subsequent encounter: Secondary | ICD-10-CM

## 2013-05-15 MED ORDER — METHYLPHENIDATE HCL 10 MG PO TABS: 10.0000 mg | ORAL_TABLET | Freq: Two times a day (BID) | ORAL | Status: DC

## 2013-05-15 NOTE — Telephone Encounter (Signed)
Patient mother request ritalin refill.  This was printed.  Left message informing mother is was ready for pick up.

## 2013-06-10 ENCOUNTER — Encounter: Payer: Medicare Other | Attending: Physical Medicine & Rehabilitation | Admitting: Physical Medicine & Rehabilitation

## 2013-06-10 ENCOUNTER — Encounter: Payer: Self-pay | Admitting: Physical Medicine & Rehabilitation

## 2013-06-10 VITALS — BP 113/62 | HR 92 | Resp 14 | Ht 65.0 in | Wt 160.0 lb

## 2013-06-10 DIAGNOSIS — M67919 Unspecified disorder of synovium and tendon, unspecified shoulder: Secondary | ICD-10-CM

## 2013-06-10 DIAGNOSIS — Z79899 Other long term (current) drug therapy: Secondary | ICD-10-CM

## 2013-06-10 DIAGNOSIS — Z5189 Encounter for other specified aftercare: Secondary | ICD-10-CM

## 2013-06-10 DIAGNOSIS — M62838 Other muscle spasm: Secondary | ICD-10-CM | POA: Insufficient documentation

## 2013-06-10 DIAGNOSIS — G8114 Spastic hemiplegia affecting left nondominant side: Secondary | ICD-10-CM

## 2013-06-10 DIAGNOSIS — S46002S Unspecified injury of muscle(s) and tendon(s) of the rotator cuff of left shoulder, sequela: Secondary | ICD-10-CM

## 2013-06-10 DIAGNOSIS — M75102 Unspecified rotator cuff tear or rupture of left shoulder, not specified as traumatic: Secondary | ICD-10-CM

## 2013-06-10 DIAGNOSIS — IMO0001 Reserved for inherently not codable concepts without codable children: Secondary | ICD-10-CM

## 2013-06-10 DIAGNOSIS — Z5181 Encounter for therapeutic drug level monitoring: Secondary | ICD-10-CM

## 2013-06-10 DIAGNOSIS — S0990XA Unspecified injury of head, initial encounter: Secondary | ICD-10-CM

## 2013-06-10 DIAGNOSIS — G8110 Spastic hemiplegia affecting unspecified side: Secondary | ICD-10-CM

## 2013-06-10 DIAGNOSIS — N3289 Other specified disorders of bladder: Secondary | ICD-10-CM

## 2013-06-10 DIAGNOSIS — S069X0D Unspecified intracranial injury without loss of consciousness, subsequent encounter: Secondary | ICD-10-CM

## 2013-06-10 MED ORDER — METHYLPHENIDATE HCL 10 MG PO TABS: 10.0000 mg | ORAL_TABLET | Freq: Two times a day (BID) | ORAL | Status: DC

## 2013-06-10 MED ORDER — CITALOPRAM HYDROBROMIDE 10 MG PO TABS: 10.0000 mg | ORAL_TABLET | Freq: Every day | ORAL | Status: DC

## 2013-06-10 MED ORDER — SOLIFENACIN SUCCINATE 5 MG PO TABS: 5.0000 mg | ORAL_TABLET | Freq: Every day | ORAL | Status: DC

## 2013-06-10 MED ORDER — TIZANIDINE HCL 2 MG PO TABS: 2.0000 mg | ORAL_TABLET | Freq: Three times a day (TID) | ORAL | Status: DC

## 2013-06-10 NOTE — Patient Instructions (Signed)
CALL ME WITH ANY PROBLEMS OR QUESTIONS (#[phone]).  HAVE A GOOD DAY  

## 2013-06-10 NOTE — Progress Notes (Signed)
Botox Injection for spasticity using needle EMG guidance  Dilution: 100 Units/ml Indication: Severe spasticity which interferes with ADL,mobility and/or  hygiene and is unresponsive to medication management and other conservative care Informed consent was obtained after describing risks and benefits of the procedure with the patient. This includes bleeding, bruising, infection, excessive weakness, or medication side effects. A REMS form is on file and signed. Needle:45mm monopolar injectable needle electrode Number of units per muscle   Gastrosoleus 200units via 6 access points Tibialis posterio: 200units via 8 access points. All injections were done after obtaining appropriate EMG activity and after negative drawback for blood. The patient tolerated the procedure well. Post procedure instructions were given. A followup appointment was made for 3 months. We reviewed appropriate gait technique and exercise.

## 2013-07-13 ENCOUNTER — Other Ambulatory Visit: Payer: Self-pay | Admitting: Physical Medicine & Rehabilitation

## 2013-08-07 ENCOUNTER — Telehealth: Payer: Self-pay | Admitting: *Deleted

## 2013-08-07 NOTE — Telephone Encounter (Signed)
  Attempted to call about missing ritalin in UDS.  Phone not accepting incoming calls and no answer on mothers phone.

## 2013-08-10 NOTE — Telephone Encounter (Signed)
2nd Attempt to reach [place] or family again about UDS missing Ritalin.  Either no answer or # doe not have voicmail set up to take message.

## 2013-08-12 ENCOUNTER — Telehealth: Payer: Self-pay

## 2013-08-12 DIAGNOSIS — S069X0D Unspecified intracranial injury without loss of consciousness, subsequent encounter: Secondary | ICD-10-CM

## 2013-08-12 DIAGNOSIS — M75102 Unspecified rotator cuff tear or rupture of left shoulder, not specified as traumatic: Secondary | ICD-10-CM

## 2013-08-12 MED ORDER — METHYLPHENIDATE HCL 10 MG PO TABS: 10.0000 mg | ORAL_TABLET | Freq: Two times a day (BID) | ORAL | Status: DC

## 2013-08-12 NOTE — Telephone Encounter (Signed)
Talked with Honi's father about the lack of ritalin in her UDS.  He says she takes it but there was a few days when she was out between the time she ran out and got a refill.  I reinforced that she needs to take it like it is prescribed and not allow it to run out in the future.  RX printed for

## 2013-08-12 NOTE — Telephone Encounter (Signed)
Patients father called for ritalin and vesicare refill.  Left message for call back regarding ritalin and urine drug screen.

## 2013-08-18 ENCOUNTER — Telehealth: Payer: Self-pay

## 2013-08-18 NOTE — Telephone Encounter (Signed)
[facility] backed and clarified the Ritalin script.

## 2013-08-18 NOTE — Telephone Encounter (Signed)
[facility] called to clarify where ritalin script was printed.

## 2013-09-11 ENCOUNTER — Telehealth: Payer: Self-pay | Admitting: *Deleted

## 2013-09-11 ENCOUNTER — Encounter: Payer: Medicare Other | Admitting: Physical Medicine & Rehabilitation

## 2013-09-11 DIAGNOSIS — S069X0D Unspecified intracranial injury without loss of consciousness, subsequent encounter: Secondary | ICD-10-CM

## 2013-09-11 DIAGNOSIS — M75102 Unspecified rotator cuff tear or rupture of left shoulder, not specified as traumatic: Secondary | ICD-10-CM

## 2013-09-11 MED ORDER — METHYLPHENIDATE HCL 10 MG PO TABS: 10.0000 mg | ORAL_TABLET | Freq: Two times a day (BID) | ORAL | Status: DC

## 2013-09-11 MED ORDER — SOLIFENACIN SUCCINATE 5 MG PO TABS: ORAL_TABLET | ORAL | Status: DC

## 2013-09-11 NOTE — Telephone Encounter (Signed)
Need refill on vesicare and ritalin.  These will need to be picked up Monday afternoon. Vesicare reordered. Ritalin rx printed for Riley Kill to sign

## 2013-10-12 ENCOUNTER — Telehealth: Payer: Self-pay

## 2013-10-12 DIAGNOSIS — G8114 Spastic hemiplegia affecting left nondominant side: Secondary | ICD-10-CM

## 2013-10-12 DIAGNOSIS — M75102 Unspecified rotator cuff tear or rupture of left shoulder, not specified as traumatic: Secondary | ICD-10-CM

## 2013-10-12 DIAGNOSIS — N3289 Other specified disorders of bladder: Secondary | ICD-10-CM

## 2013-10-12 DIAGNOSIS — S069X0D Unspecified intracranial injury without loss of consciousness, subsequent encounter: Secondary | ICD-10-CM

## 2013-10-12 MED ORDER — TIZANIDINE HCL 2 MG PO TABS: 2.0000 mg | ORAL_TABLET | Freq: Three times a day (TID) | ORAL | Status: DC

## 2013-10-12 MED ORDER — METHYLPHENIDATE HCL 10 MG PO TABS: 10.0000 mg | ORAL_TABLET | Freq: Two times a day (BID) | ORAL | Status: DC

## 2013-10-12 NOTE — Telephone Encounter (Signed)
Patient father aware ritalin rx is ready for pick up.

## 2013-10-12 NOTE — Telephone Encounter (Signed)
Patients father called for ritalin and tizanidine refill.  Rx printed.  Will contact father when signed.

## 2013-10-20 ENCOUNTER — Encounter: Payer: Self-pay | Admitting: Physical Medicine & Rehabilitation

## 2013-10-20 ENCOUNTER — Encounter: Payer: Medicare Other | Attending: Physical Medicine & Rehabilitation | Admitting: Physical Medicine & Rehabilitation

## 2013-10-20 VITALS — BP 111/68 | HR 75 | Resp 14 | Ht 64.0 in | Wt 166.0 lb

## 2013-10-20 DIAGNOSIS — S069X9S Unspecified intracranial injury with loss of consciousness of unspecified duration, sequela: Secondary | ICD-10-CM

## 2013-10-20 DIAGNOSIS — G8110 Spastic hemiplegia affecting unspecified side: Secondary | ICD-10-CM

## 2013-10-20 DIAGNOSIS — M75102 Unspecified rotator cuff tear or rupture of left shoulder, not specified as traumatic: Secondary | ICD-10-CM

## 2013-10-20 DIAGNOSIS — M719 Bursopathy, unspecified: Secondary | ICD-10-CM | POA: Insufficient documentation

## 2013-10-20 DIAGNOSIS — S069XAS Unspecified intracranial injury with loss of consciousness status unknown, sequela: Secondary | ICD-10-CM

## 2013-10-20 DIAGNOSIS — S069X0D Unspecified intracranial injury without loss of consciousness, subsequent encounter: Secondary | ICD-10-CM

## 2013-10-20 DIAGNOSIS — G8114 Spastic hemiplegia affecting left nondominant side: Secondary | ICD-10-CM

## 2013-10-20 DIAGNOSIS — Z5189 Encounter for other specified aftercare: Secondary | ICD-10-CM

## 2013-10-20 DIAGNOSIS — M67919 Unspecified disorder of synovium and tendon, unspecified shoulder: Secondary | ICD-10-CM

## 2013-10-20 DIAGNOSIS — S069X0S Unspecified intracranial injury without loss of consciousness, sequela: Secondary | ICD-10-CM

## 2013-10-20 MED ORDER — METHYLPHENIDATE HCL 10 MG PO TABS: 10.0000 mg | ORAL_TABLET | Freq: Two times a day (BID) | ORAL | Status: DC

## 2013-10-20 NOTE — Progress Notes (Signed)
Subjective:    Patient ID: [name], female    DOB: [birth year], 32 y.o.   MRN: [id number]  HPI  [name] is back regarding her TBI and spastic left hemiparesis. She fell twice last week when she got tangling up in her closet and some things around the room.  She is working on stretching and exercises each day. She likes to go to the gym and use the stationary bike, sometimes the treadmill, and the elliptical.  She is now home alone and things seem to be going well. She does things in the yard, walks in the neighborhood, etc.   Dad noticed a difference in her attention with the decreased ritalin, although [name] is unconvinced. Dad also thinks it may be something he's over analyzing as well.   Pain Inventory Average Pain 0 Pain Right Now 0 My pain is n/a  In the last 24 hours, has pain interfered with the following? General activity 0 Relation with others 0 Enjoyment of life 0 What TIME of day is your pain at its worst? n/a Sleep (in general) Fair  Pain is worse with: some activites Pain improves with: pacing activities Relief from Meds: n/a  Mobility how many minutes can you walk? 120 ability to climb steps?  yes do you drive?  no Do you have any goals in this area?  yes  Function disabled: date disabled 10/2010 Do you have any goals in this area?  yes  Neuro/Psych confusion  Prior Studies Any changes since last visit?  no  Physicians involved in your care Any changes since last visit?  no   History reviewed. No pertinent family history. History   Social History  . Marital Status: Single    Spouse Name: N/A    Number of Children: N/A  . Years of Education: N/A   Social History Main Topics  . Smoking status: Former Smoker    Quit date: 09/25/2011  . Smokeless tobacco: Never Used  . Alcohol Use: None  . Drug Use: None  . Sexual Activity: None   Other Topics Concern  . None   Social History Narrative  . None   Past Surgical History   Procedure Laterality Date  . Shunt placed  in head  2011   Past Medical History  Diagnosis Date  . Intracranial injury of other and unspecified nature, without mention of open intracranial wound, unspecified state of consciousness   . Spastic hemiplegia affecting nondominant side   . Personality change due to conditions classified elsewhere    BP 111/68  Pulse 75  Resp 14  Ht 5\' 4"  (1.626 m)  Wt 166 lb (75.297 kg)  BMI 28.48 kg/m2  SpO2 97%     Review of Systems  Musculoskeletal: Positive for gait problem.  Psychiatric/Behavioral: Positive for confusion.  All other systems reviewed and are negative.       Objective:   Physical Exam  Is alert and appropriate. She is oriented x2. Attention is fair. stm is limited. She ambulated for me today and continues to walk with left foot in a equinovarus position. She will circumduct her left leg to clear it to a certain extent and moves the left leg "en bloc".. She is generally stable however. Spasticity in the left leg remains at 1-2/4. She's run out of her to perhaps 1+ out of 4 left upper extremity. LUE motor is 3-4/5. She is blocked by a little tightness at the left shoulder. This is generally due to tightness in the  shoulder itself, which has improved quite a bit. She may have trace tone at the left pec major, lats, otherwise tone was 0 in the upper ext. LLE is 3/t prox to 1-2 distally. Tone at the ankle is1+ to 2 with plantar flexion and inversion. i am able to passively ADF the left to neutral.   l dtr's are 2-3+. Cranial nerve exam grossly intact.  Heart regular. Chest is clear. Abdomen soft nontender.   Assessment & Plan:   ASSESSMENT:  1. Traumatic brain injury with spastic left hemiparesis and ongoing cognitive deficits.  2. Frozen left shoulder    PLAN:  1. Repeat Botox to the gastroc, tib posterior in January. 300 u. 2. Again discussed aggressive ROM is important especially before exercise. Raised the possibility of tendon  lengthening surgery as well.  2. continue zanaflex and vesicare. 3. Continue Ritalin to 10 mg b.i.d. #60. Will not decrease further. If anything may need to increase it back again, but will hold off for now.  4. All questions were encouraged and answered. 25 minutes were spent with the patient today.  I'll see her back in 3 months.

## 2013-10-20 NOTE — Patient Instructions (Signed)
CONTINUE TO WORK ON STRETCHING YOUR CALF AND ANKLE. STRETCH AND HEAT IT BEFORE YOU EXERCISE

## 2013-12-02 ENCOUNTER — Encounter: Payer: Medicare Other | Attending: Physical Medicine & Rehabilitation | Admitting: Physical Medicine & Rehabilitation

## 2013-12-02 ENCOUNTER — Encounter: Payer: Self-pay | Admitting: Physical Medicine & Rehabilitation

## 2013-12-02 VITALS — BP 114/70 | HR 93 | Resp 14 | Ht 64.0 in | Wt 167.0 lb

## 2013-12-02 DIAGNOSIS — M719 Bursopathy, unspecified: Principal | ICD-10-CM | POA: Insufficient documentation

## 2013-12-02 DIAGNOSIS — G8114 Spastic hemiplegia affecting left nondominant side: Secondary | ICD-10-CM

## 2013-12-02 DIAGNOSIS — M67919 Unspecified disorder of synovium and tendon, unspecified shoulder: Secondary | ICD-10-CM

## 2013-12-02 DIAGNOSIS — M75102 Unspecified rotator cuff tear or rupture of left shoulder, not specified as traumatic: Secondary | ICD-10-CM

## 2013-12-02 DIAGNOSIS — G8110 Spastic hemiplegia affecting unspecified side: Secondary | ICD-10-CM | POA: Diagnosis not present

## 2013-12-02 MED ORDER — METHYLPHENIDATE HCL 10 MG PO TABS: 10.0000 mg | ORAL_TABLET | Freq: Two times a day (BID) | ORAL | Status: DC

## 2013-12-02 NOTE — Progress Notes (Signed)
Botox Injection for spasticity using needle EMG guidance Indication: spastic hemiparesis non-dominant limb, left  Dilution: 100 Units/ml        Total Units Injected: 300 Indication: Severe spasticity which interferes with ADL,mobility and/or  hygiene and is unresponsive to medication management and other conservative care Informed consent was obtained after describing risks and benefits of the procedure with the patient. This includes bleeding, bruising, infection, excessive weakness, or medication side effects. A REMS form is on file and signed.  Needle: 50mm injectable monopolar needle electrode  Number of units per muscle Gastroc/soleus 200 units Tibialis Posterior 100 units   All injections were done after obtaining appropriate EMG activity and after negative drawback for blood. The patient tolerated the procedure well. Post procedure instructions were given. A followup appointment was made.

## 2013-12-02 NOTE — Patient Instructions (Signed)
PLEASE CALL ME WITH ANY PROBLEMS OR QUESTIONS (#[phone]).      

## 2014-01-11 ENCOUNTER — Other Ambulatory Visit: Payer: Self-pay | Admitting: Physical Medicine & Rehabilitation

## 2014-01-11 MED ORDER — SOLIFENACIN SUCCINATE 5 MG PO TABS: ORAL_TABLET | ORAL | Status: DC

## 2014-02-16 ENCOUNTER — Telehealth: Payer: Self-pay

## 2014-02-16 ENCOUNTER — Other Ambulatory Visit: Payer: Self-pay

## 2014-02-16 DIAGNOSIS — N3289 Other specified disorders of bladder: Secondary | ICD-10-CM

## 2014-02-16 DIAGNOSIS — G8114 Spastic hemiplegia affecting left nondominant side: Secondary | ICD-10-CM

## 2014-02-16 DIAGNOSIS — M75102 Unspecified rotator cuff tear or rupture of left shoulder, not specified as traumatic: Secondary | ICD-10-CM

## 2014-02-16 MED ORDER — SOLIFENACIN SUCCINATE 5 MG PO TABS: ORAL_TABLET | ORAL | Status: DC

## 2014-02-16 MED ORDER — TIZANIDINE HCL 2 MG PO TABS: 2.0000 mg | ORAL_TABLET | Freq: Three times a day (TID) | ORAL | Status: DC

## 2014-02-16 MED ORDER — CITALOPRAM HYDROBROMIDE 10 MG PO TABS: ORAL_TABLET | ORAL | Status: DC

## 2014-02-16 MED ORDER — METHYLPHENIDATE HCL 10 MG PO TABS: 10.0000 mg | ORAL_TABLET | Freq: Two times a day (BID) | ORAL | Status: DC

## 2014-02-16 NOTE — Telephone Encounter (Signed)
Patient is requesting a refill on Ritalin, Zanaflex, Vesicare, and Citalopram. Ritalin RX was printed for Dr. Riley KillSwartz to sign. Will contact patient when ready to pickup.  Zanaflex, Vesicare, and Citalopram were all e scribed.

## 2014-02-16 NOTE — Telephone Encounter (Signed)
Attempted to contact patient at all 3 different numbers in chart. No answer nor voicemail on 1st number and other 2 numbers are invalid. Patient's Ritalin RX is ready for pickup at front desk.

## 2014-03-16 ENCOUNTER — Telehealth: Payer: Self-pay

## 2014-03-16 DIAGNOSIS — M75102 Unspecified rotator cuff tear or rupture of left shoulder, not specified as traumatic: Secondary | ICD-10-CM

## 2014-03-16 MED ORDER — METHYLPHENIDATE HCL 10 MG PO TABS: 10.0000 mg | ORAL_TABLET | Freq: Two times a day (BID) | ORAL | Status: DC

## 2014-03-16 MED ORDER — CITALOPRAM HYDROBROMIDE 10 MG PO TABS: ORAL_TABLET | ORAL | Status: DC

## 2014-03-16 NOTE — Telephone Encounter (Signed)
Contacted patient to inform her that the Ritalin RX is ready for pickup , and the Celexa was sent to the pharmacy.

## 2014-03-16 NOTE — Telephone Encounter (Signed)
Patient is requesting a refill on Ritalin and Celexa. Ritalin RX printed for Dr. Riley KillSwartz to sign. Celexa was e scribed to pharmacy. Will contact patient when ready for pickup.

## 2014-04-02 ENCOUNTER — Encounter: Payer: Medicare Other | Admitting: Physical Medicine & Rehabilitation

## 2014-04-16 ENCOUNTER — Telehealth: Payer: Self-pay

## 2014-04-16 DIAGNOSIS — M75102 Unspecified rotator cuff tear or rupture of left shoulder, not specified as traumatic: Secondary | ICD-10-CM

## 2014-04-16 MED ORDER — METHYLPHENIDATE HCL 10 MG PO TABS: 10.0000 mg | ORAL_TABLET | Freq: Two times a day (BID) | ORAL | Status: DC

## 2014-04-16 MED ORDER — SOLIFENACIN SUCCINATE 5 MG PO TABS: ORAL_TABLET | ORAL | Status: DC

## 2014-04-16 MED ORDER — CITALOPRAM HYDROBROMIDE 10 MG PO TABS: ORAL_TABLET | ORAL | Status: DC

## 2014-04-16 NOTE — Telephone Encounter (Signed)
Patient is requesting a refill on Vesicare, Celexa, and Ritalin. Ritalin RX printed for Dr. Riley KillSwartz to sign. Will contact patient when ready for pickup. Vesicare and Celexa RX sent to pharmacy.

## 2014-04-19 NOTE — Telephone Encounter (Signed)
Contacted patient to inform her that her Ritalin RX is ready for pickup.

## 2014-05-19 ENCOUNTER — Telehealth: Payer: Self-pay

## 2014-05-19 DIAGNOSIS — M75102 Unspecified rotator cuff tear or rupture of left shoulder, not specified as traumatic: Secondary | ICD-10-CM

## 2014-05-19 MED ORDER — METHYLPHENIDATE HCL 10 MG PO TABS: 10.0000 mg | ORAL_TABLET | Freq: Two times a day (BID) | ORAL | Status: DC

## 2014-05-19 NOTE — Telephone Encounter (Signed)
Patient is requesting a refill on Ritalin. RX printed for Dr. Riley KillSwartz to sign. Patient has appt on 7/21 that she needs to keep. Patient's last OV was 11/2013.

## 2014-05-19 NOTE — Telephone Encounter (Signed)
Contacted patient to inform her that her Ritalin RX is ready for pickup.  

## 2014-05-25 ENCOUNTER — Encounter: Payer: Self-pay | Admitting: Physical Medicine & Rehabilitation

## 2014-05-25 ENCOUNTER — Encounter: Payer: Medicare Other | Attending: Physical Medicine & Rehabilitation | Admitting: Physical Medicine & Rehabilitation

## 2014-05-25 VITALS — BP 107/65 | HR 87 | Resp 14 | Ht 64.0 in | Wt 166.0 lb

## 2014-05-25 DIAGNOSIS — M67919 Unspecified disorder of synovium and tendon, unspecified shoulder: Secondary | ICD-10-CM | POA: Diagnosis not present

## 2014-05-25 DIAGNOSIS — Z87820 Personal history of traumatic brain injury: Secondary | ICD-10-CM | POA: Diagnosis not present

## 2014-05-25 DIAGNOSIS — M75102 Unspecified rotator cuff tear or rupture of left shoulder, not specified as traumatic: Secondary | ICD-10-CM

## 2014-05-25 DIAGNOSIS — M7500 Adhesive capsulitis of unspecified shoulder: Secondary | ICD-10-CM | POA: Insufficient documentation

## 2014-05-25 DIAGNOSIS — M719 Bursopathy, unspecified: Secondary | ICD-10-CM

## 2014-05-25 DIAGNOSIS — S069X5A Unspecified intracranial injury with loss of consciousness greater than 24 hours with return to pre-existing conscious level, initial encounter: Secondary | ICD-10-CM | POA: Diagnosis not present

## 2014-05-25 DIAGNOSIS — G8114 Spastic hemiplegia affecting left nondominant side: Secondary | ICD-10-CM

## 2014-05-25 DIAGNOSIS — G8110 Spastic hemiplegia affecting unspecified side: Secondary | ICD-10-CM | POA: Diagnosis not present

## 2014-05-25 MED ORDER — METHYLPHENIDATE HCL 10 MG PO TABS: 10.0000 mg | ORAL_TABLET | Freq: Two times a day (BID) | ORAL | Status: DC

## 2014-05-25 NOTE — Progress Notes (Signed)
Subjective:    Patient ID: [name], female    DOB: 09/21/1981, 33 y.o.   MRN: [id number]  HPI  [name] is back regarding her TBI and associated deficits. She has been going to the [facility] twice a week for fitness training. She has been working on IT sales professional.   She has met a companion who is associated with her church.  This has added another dimension to her life which was missing before.   She denies any new problems at home. She is independent with her self-care, most house chores, etc.   We last did botox in January, and [name] feels that it continues to really benefit her, especially when she is exercising and going to the [facility].  Pain Inventory Average Pain 2 Pain Right Now 0 My pain is dull  In the last 24 hours, has pain interfered with the following? General activity 2 Relation with others 0 Enjoyment of life 1 What TIME of day is your pain at its worst? evening Sleep (in general) Good  Pain is worse with: unsure Pain improves with: rest, therapy/exercise, medication and injections Relief from Meds: n/a  Mobility walk without assistance how many minutes can you walk? 30-45 ability to climb steps?  yes do you drive?  no Do you have any goals in this area?  yes  Function disabled: date disabled 10/2010 Do you have any goals in this area?  yes  Neuro/Psych anxiety  Prior Studies Any changes since last visit?  no  Physicians involved in your care Any changes since last visit?  no   History reviewed. No pertinent family history. History   Social History  . Marital Status: Single    Spouse Name: N/A    Number of Children: N/A  . Years of Education: N/A   Social History Main Topics  . Smoking status: Former Smoker    Quit date: 09/25/2011  . Smokeless tobacco: Never Used  . Alcohol Use: None  . Drug Use: None  . Sexual Activity: None   Other Topics Concern  . None   Social History Narrative  . None   Past Surgical History    Procedure Laterality Date  . Shunt placed  in head  2011   Past Medical History  Diagnosis Date  . Intracranial injury of other and unspecified nature, without mention of open intracranial wound, unspecified state of consciousness   . Spastic hemiplegia affecting nondominant side   . Personality change due to conditions classified elsewhere    BP 107/65  Pulse 87  Resp 14  Ht _0  (1.626 m)  Wt 166 lb (75.297 kg)  BMI 28.48 kg/m2  SpO2 98%  Opioid Risk Score:   Fall Risk Score: Low Fall Risk (0-5 points) (patient educated handout declined)   Review of Systems  Gastrointestinal: Positive for abdominal pain.  Musculoskeletal: Positive for gait problem.  Psychiatric/Behavioral: The patient is nervous/anxious.   All other systems reviewed and are negative.      Objective:   Physical Exam  Alert and appropriate. She is oriented x2. Attention is improved. stm is improving as is attention. She ambulated for me today and continues to walk with left foot in a equinovarus position.  She uses less circumduction but does employ a steppage. Spasticity in the left leg remains at 1-2/4. I think her Tib anterior is tightest today, perhaps tibialis posterior, gastroc.  LUE motor is 3-4/5.  Shoulder is looser. Left shoulder ROM has improved quite a bit.  LLE is 3/5 prox to 2 distally. Tone at the ankle is 1+ to 2 with plantar flexion and inversion. i am able to passively ADF the left to neutral. LLE  dtr's are 2-3+. Cranial nerve exam grossly intact. Heart regular. Chest is clear. Abdomen soft nontender.   Assessment & Plan:   ASSESSMENT:  1. Traumatic brain injury with spastic left hemiparesis and ongoing cognitive deficits.  2. Frozen left shoulder   PLAN:  1. Repeat Botox to the left tib anterior, gastroc, tib posterior, 300 units in about a month 2. Continue zanaflex and vesicare.  3. Continue Ritalin to 10 mg b.i.d. #60. (for next month)  4. All questions were encouraged and  answered. 15 minutes were spent with the patient today.  I'll see her back pending above

## 2014-05-25 NOTE — Patient Instructions (Signed)
PLEASE CALL ME WITH ANY PROBLEMS OR QUESTIONS (#[phone]).      

## 2014-06-17 ENCOUNTER — Other Ambulatory Visit: Payer: Self-pay | Admitting: Physical Medicine & Rehabilitation

## 2014-07-13 ENCOUNTER — Telehealth: Payer: Self-pay | Admitting: *Deleted

## 2014-07-13 NOTE — Telephone Encounter (Signed)
Called to find out when her appt for Botox is with  Dr Riley Kill.  It is 07/16/14 at 9:20 am. Notified Steph.  She is going to see if her dad can make it or else she will have to reschedule.

## 2014-07-16 ENCOUNTER — Encounter: Payer: Medicare Other | Admitting: Physical Medicine & Rehabilitation

## 2014-07-16 ENCOUNTER — Other Ambulatory Visit: Payer: Self-pay | Admitting: Physical Medicine & Rehabilitation

## 2014-07-19 ENCOUNTER — Telehealth: Payer: Self-pay

## 2014-07-19 DIAGNOSIS — G8114 Spastic hemiplegia affecting left nondominant side: Secondary | ICD-10-CM

## 2014-07-19 DIAGNOSIS — S069X5A Unspecified intracranial injury with loss of consciousness greater than 24 hours with return to pre-existing conscious level, initial encounter: Secondary | ICD-10-CM

## 2014-07-19 MED ORDER — METHYLPHENIDATE HCL 10 MG PO TABS: 10.0000 mg | ORAL_TABLET | Freq: Two times a day (BID) | ORAL | Status: DC

## 2014-07-19 MED ORDER — TIZANIDINE HCL 2 MG PO TABS: ORAL_TABLET | ORAL | Status: DC

## 2014-07-19 MED ORDER — SOLIFENACIN SUCCINATE 5 MG PO TABS: ORAL_TABLET | ORAL | Status: DC

## 2014-07-19 NOTE — Telephone Encounter (Signed)
Patient is requesting a refill on Ritalin, Vesicare, Zanaflex, and Celexa. Ritalin RX printed for Dr. Riley Kill to sign, other RX sent to pharmacy. Patient is aware she can pickup RX this afternoon.

## 2014-07-20 ENCOUNTER — Telehealth: Payer: Self-pay | Admitting: *Deleted

## 2014-07-20 NOTE — Telephone Encounter (Signed)
error 

## 2014-08-20 ENCOUNTER — Other Ambulatory Visit: Payer: Self-pay | Admitting: *Deleted

## 2014-08-20 ENCOUNTER — Telehealth: Payer: Self-pay | Admitting: Physical Medicine & Rehabilitation

## 2014-08-20 DIAGNOSIS — G8114 Spastic hemiplegia affecting left nondominant side: Secondary | ICD-10-CM

## 2014-08-20 DIAGNOSIS — S069X5A Unspecified intracranial injury with loss of consciousness greater than 24 hours with return to pre-existing conscious level, initial encounter: Secondary | ICD-10-CM

## 2014-08-20 MED ORDER — SOLIFENACIN SUCCINATE 5 MG PO TABS: ORAL_TABLET | ORAL | Status: DC

## 2014-08-20 MED ORDER — METHYLPHENIDATE HCL 10 MG PO TABS: 10.0000 mg | ORAL_TABLET | Freq: Two times a day (BID) | ORAL | Status: DC

## 2014-08-20 MED ORDER — CITALOPRAM HYDROBROMIDE 10 MG PO TABS: 10.0000 mg | ORAL_TABLET | Freq: Every day | ORAL | Status: DC

## 2014-08-20 MED ORDER — TIZANIDINE HCL 2 MG PO TABS: ORAL_TABLET | ORAL | Status: DC

## 2014-08-20 NOTE — Telephone Encounter (Signed)
Called patient explain cannot call in RX for Rittilin  But the others were called into the pharmacy this afternoon.  She can pick up the Rittilin on Monday.

## 2014-08-20 NOTE — Telephone Encounter (Signed)
Patient needs refills on Ritalin; Vesicar; Tizanaden and Citalopram called into her pharmacy [facility]  [phone]

## 2014-08-23 NOTE — Telephone Encounter (Signed)
Called patient and left message her RX is ready for pick up

## 2014-08-30 ENCOUNTER — Encounter: Payer: Self-pay | Admitting: Physical Medicine & Rehabilitation

## 2014-08-30 ENCOUNTER — Encounter: Payer: Medicare Other | Attending: Physical Medicine & Rehabilitation | Admitting: Physical Medicine & Rehabilitation

## 2014-08-30 VITALS — BP 132/78 | HR 68 | Resp 14 | Ht 64.0 in | Wt 166.0 lb

## 2014-08-30 DIAGNOSIS — S069X5S Unspecified intracranial injury with loss of consciousness greater than 24 hours with return to pre-existing conscious level, sequela: Secondary | ICD-10-CM | POA: Diagnosis not present

## 2014-08-30 DIAGNOSIS — G8114 Spastic hemiplegia affecting left nondominant side: Secondary | ICD-10-CM | POA: Diagnosis not present

## 2014-08-30 DIAGNOSIS — S069X5A Unspecified intracranial injury with loss of consciousness greater than 24 hours with return to pre-existing conscious level, initial encounter: Secondary | ICD-10-CM

## 2014-08-30 MED ORDER — METHYLPHENIDATE HCL 10 MG PO TABS
10.0000 mg | ORAL_TABLET | Freq: Two times a day (BID) | ORAL | Status: DC
Start: 2014-08-30 — End: 2014-11-29

## 2014-08-30 MED ORDER — METHYLPHENIDATE HCL 10 MG PO TABS: 10.0000 mg | ORAL_TABLET | Freq: Two times a day (BID) | ORAL | Status: DC

## 2014-08-30 NOTE — Progress Notes (Signed)
Botox Injection for spasticity using needle EMG guidance Indication: spastic hemiparesis, non-dominant/left limb---LLE  Dilution: 100 Units/ml        Total Units Injected: 300 Indication: Severe spasticity which interferes with ADL,mobility and/or  hygiene and is unresponsive to medication management and other conservative care Informed consent was obtained after describing risks and benefits of the procedure with the patient. This includes bleeding, bruising, infection, excessive weakness, or medication side effects. A REMS form is on file and signed.  Needle: 50mm injectable monopolar needle electrode  Number of units per muscle  Gastroc/soleus 100 units Hamstrings 0 units Tibialis Posterior 100 units Tibialis anterior 100 units All injections were done after obtaining appropriate EMG activity and after negative drawback for blood. The patient tolerated the procedure well. Post procedure instructions were given. A followup appointment was made for 3 months.

## 2014-08-30 NOTE — Patient Instructions (Signed)
Stop your vesicare and see how it goes.

## 2014-11-17 ENCOUNTER — Telehealth: Payer: Self-pay | Admitting: *Deleted

## 2014-11-17 DIAGNOSIS — G8114 Spastic hemiplegia affecting left nondominant side: Secondary | ICD-10-CM

## 2014-11-17 NOTE — Telephone Encounter (Signed)
Patient called about refill on Citalopram to be called into the [facility] on [address]

## 2014-11-19 MED ORDER — CITALOPRAM HYDROBROMIDE 10 MG PO TABS: 10.0000 mg | ORAL_TABLET | Freq: Every day | ORAL | Status: DC

## 2014-11-19 MED ORDER — TIZANIDINE HCL 2 MG PO TABS: ORAL_TABLET | ORAL | Status: DC

## 2014-11-19 NOTE — Telephone Encounter (Signed)
Patient called back asking for refills on citalopram and tizanidine

## 2014-11-19 NOTE — Telephone Encounter (Signed)
Refills sent

## 2014-11-29 ENCOUNTER — Other Ambulatory Visit: Payer: Self-pay | Admitting: Physical Medicine & Rehabilitation

## 2014-11-29 ENCOUNTER — Encounter: Payer: Medicare Other | Attending: Physical Medicine & Rehabilitation | Admitting: Physical Medicine & Rehabilitation

## 2014-11-29 ENCOUNTER — Encounter: Payer: Self-pay | Admitting: Physical Medicine & Rehabilitation

## 2014-11-29 VITALS — BP 122/78 | HR 90 | Resp 14

## 2014-11-29 DIAGNOSIS — S069X5A Unspecified intracranial injury with loss of consciousness greater than 24 hours with return to pre-existing conscious level, initial encounter: Secondary | ICD-10-CM | POA: Diagnosis not present

## 2014-11-29 DIAGNOSIS — G894 Chronic pain syndrome: Secondary | ICD-10-CM | POA: Insufficient documentation

## 2014-11-29 DIAGNOSIS — G8114 Spastic hemiplegia affecting left nondominant side: Secondary | ICD-10-CM | POA: Insufficient documentation

## 2014-11-29 DIAGNOSIS — Z79899 Other long term (current) drug therapy: Secondary | ICD-10-CM | POA: Diagnosis not present

## 2014-11-29 DIAGNOSIS — Z5181 Encounter for therapeutic drug level monitoring: Secondary | ICD-10-CM | POA: Diagnosis not present

## 2014-11-29 DIAGNOSIS — S069X0S Unspecified intracranial injury without loss of consciousness, sequela: Secondary | ICD-10-CM

## 2014-11-29 MED ORDER — METHYLPHENIDATE HCL 10 MG PO TABS: 10.0000 mg | ORAL_TABLET | Freq: Two times a day (BID) | ORAL | Status: DC

## 2014-11-29 NOTE — Progress Notes (Signed)
Subjective:    Patient ID: [name], female    DOB: 05-20-1981, 34 y.o.   MRN: [id number]   HPI  [name] is back regarding here regarding her traumatic brain injury.  She is doing fairly well overall.  Pain is under control.   She and her father are in the process of moving which has been a little upsetting but overall she's handling it well.   Botox seems to have worked fairly well. She is stretching and walking daily.    Pain Inventory Average Pain 0 Pain Right Now 0 My pain is NO PAIN  In the last 24 hours, has pain interfered with the following? General activity 2 Relation with others 2 Enjoyment of life 2 What TIME of day is your pain at its worst? NO PAIN Sleep (in general) Fair  Pain is worse with: NO PAIN Pain improves with: NO PAIN Relief from Meds: 0  Mobility walk without assistance how many minutes can you walk? 60 ability to climb steps?  yes do you drive?  yes  Function disabled: date disabled .  Neuro/Psych bladder control problems trouble walking  Prior Studies Any changes since last visit?  no  Physicians involved in your care Any changes since last visit?  no   History reviewed. No pertinent family history. History   Social History  . Marital Status: Single    Spouse Name: N/A    Number of Children: N/A  . Years of Education: N/A   Social History Main Topics  . Smoking status: Former Smoker    Quit date: 09/25/2011  . Smokeless tobacco: Never Used  . Alcohol Use: None  . Drug Use: None  . Sexual Activity: None   Other Topics Concern  . None   Social History Narrative   Past Surgical History  Procedure Laterality Date  . Shunt placed  in head  2011   Past Medical History  Diagnosis Date  . Intracranial injury of other and unspecified nature, without mention of open intracranial wound, unspecified state of consciousness   . Spastic hemiplegia affecting nondominant side   . Personality change due to conditions  classified elsewhere    BP 122/78 mmHg  Pulse 90  Resp 14  SpO2 100%  Opioid Risk Score:   Fall Risk Score: Low Fall Risk (0-5 points)   Review of Systems  HENT: Negative.   Eyes: Negative.   Respiratory: Negative.   Cardiovascular: Negative.   Gastrointestinal: Negative.   Endocrine: Negative.   Genitourinary: Positive for dysuria and difficulty urinating.  Musculoskeletal: Negative.   Skin: Negative.   Allergic/Immunologic: Negative.   Neurological: Positive for weakness and numbness.  Hematological: Negative.   Psychiatric/Behavioral: Negative.        Objective:   Physical Exam Alert and appropriate. She is oriented x2. Attention is improved. stm is improving as is attention. She ambulated for me today and continues to walk with left foot in a equinovarus position. She uses less circumduction but does employ a steppage. Spasticity in the left leg remains at 1-2/4. I think her Tib anterior is tightest today, perhaps tibialis posterior, gastroc. LUE motor is 3-4/5. Shoulder is looser. Left shoulder ROM has improved quite a bit. LLE is 3/5 prox to 2 distally. Tone at the ankle is 1+ to 2+ with plantar flexion and inversion.  Gait has improved but she still has a bit of an extensor pattern and uses the left hip to swing.  LLE dtr's are 2-3+. Cranial nerve exam  grossly intact. Heart regular. Chest is clear. Abdomen soft nontender.    Assessment & Plan:   ASSESSMENT:  1. Traumatic brain injury with spastic left hemiparesis and ongoing cognitive deficits.  2. Frozen left shoulder    PLAN:  1. No further botox at this time. May have reached the max benefit. Stressed the importance of working on regular stretching and gait technique/mechanics 2. Continue zanaflex and vesicare.  3. Continue Ritalin to 10 mg b.i.d. #60. (for next month)  4. All questions were encouraged and answered. 15 minutes were spent with the patient today.   We discussed driving today, and I  gave her permission to try driving with father or family in off-road (nearby home) situations. If she wants.   Return in about 4 months (around 03/30/2015).

## 2014-11-29 NOTE — Patient Instructions (Signed)
PLEASE CALL ME WITH ANY PROBLEMS OR QUESTIONS (#[phone]).      

## 2014-11-30 LAB — PMP ALCOHOL METABOLITE (ETG): Ethyl Glucuronide (EtG): NEGATIVE ng/mL

## 2014-12-03 LAB — MEPERIDINE (GC/LC/MS), URINE
Meperidine (GC/LC/MS), ur confirm: NEGATIVE ng/mL (ref <100)
Normeperidine (GC/LC/MS), ur confirm: NEGATIVE ng/mL (ref <100)

## 2014-12-04 LAB — PRESCRIPTION MONITORING PROFILE (SOLSTAS)
Amphetamine/Meth: NEGATIVE ng/mL
BUPRENORPHINE, URINE: NEGATIVE ng/mL
Barbiturate Screen, Urine: NEGATIVE ng/mL
Benzodiazepine Screen, Urine: NEGATIVE ng/mL
CANNABINOID SCRN UR: NEGATIVE ng/mL
CREATININE, URINE: 78.51 mg/dL (ref >20.0)
Carisoprodol, Urine: NEGATIVE ng/mL
Cocaine Metabolites: NEGATIVE ng/mL
FENTANYL URINE: NEGATIVE ng/mL
MDMA URINE: NEGATIVE ng/mL
Methadone Screen, Urine: NEGATIVE ng/mL
NITRITES URINE, INITIAL: NEGATIVE ug/mL
OPIATE SCREEN, URINE: NEGATIVE ng/mL
OXYCODONE SCRN UR: NEGATIVE ng/mL
PH URINE, INITIAL: 7.6 pH (ref 4.5–8.9)
PROPOXYPHENE: NEGATIVE ng/mL
Tapentadol, urine: NEGATIVE ng/mL
Tramadol Scrn, Ur: NEGATIVE ng/mL
Zolpidem, Urine: NEGATIVE ng/mL

## 2014-12-14 NOTE — Progress Notes (Signed)
Urine drug screen for this encounter is consistent for no narcotic.  [facility] did not test for the ritalin

## 2014-12-28 ENCOUNTER — Telehealth: Payer: Self-pay | Admitting: *Deleted

## 2014-12-28 DIAGNOSIS — G8114 Spastic hemiplegia affecting left nondominant side: Secondary | ICD-10-CM

## 2014-12-28 MED ORDER — SOLIFENACIN SUCCINATE 5 MG PO TABS: ORAL_TABLET | ORAL | Status: DC

## 2014-12-28 NOTE — Telephone Encounter (Signed)
Patient called and requested refill on Vesicar.

## 2014-12-28 NOTE — Telephone Encounter (Signed)
done

## 2015-01-24 ENCOUNTER — Telehealth: Payer: Self-pay | Admitting: *Deleted

## 2015-01-24 DIAGNOSIS — G8114 Spastic hemiplegia affecting left nondominant side: Secondary | ICD-10-CM

## 2015-01-24 MED ORDER — SOLIFENACIN SUCCINATE 5 MG PO TABS: ORAL_TABLET | ORAL | Status: DC

## 2015-01-24 NOTE — Telephone Encounter (Signed)
Needs refill on her vesicare. Done

## 2015-01-27 ENCOUNTER — Telehealth: Payer: Self-pay | Admitting: *Deleted

## 2015-01-27 DIAGNOSIS — S069X5A Unspecified intracranial injury with loss of consciousness greater than 24 hours with return to pre-existing conscious level, initial encounter: Secondary | ICD-10-CM

## 2015-01-27 DIAGNOSIS — G8114 Spastic hemiplegia affecting left nondominant side: Secondary | ICD-10-CM

## 2015-01-27 MED ORDER — METHYLPHENIDATE HCL 10 MG PO TABS: 10.0000 mg | ORAL_TABLET | Freq: Two times a day (BID) | ORAL | Status: DC

## 2015-01-27 NOTE — Telephone Encounter (Signed)
[name] is calling about getting an rx for ritalin.  She has not been seen since January and her next appt is in May.  She will need to be seen by Riley LamEunice.  I have called and offered appt today but she has to have her dad drive her.  Next available appt is on Tuesday.  She will talk with her dad and call us back.  She has 5 pills left.

## 2015-01-27 NOTE — Telephone Encounter (Signed)
Her Father will pick up bridge rx and needs to be seen by next Wednesday by [name].

## 2015-02-02 ENCOUNTER — Encounter: Payer: Medicare Other | Attending: Physical Medicine & Rehabilitation | Admitting: Registered Nurse

## 2015-02-02 DIAGNOSIS — Z5181 Encounter for therapeutic drug level monitoring: Secondary | ICD-10-CM | POA: Insufficient documentation

## 2015-02-02 DIAGNOSIS — G894 Chronic pain syndrome: Secondary | ICD-10-CM | POA: Insufficient documentation

## 2015-02-02 DIAGNOSIS — G8114 Spastic hemiplegia affecting left nondominant side: Secondary | ICD-10-CM | POA: Insufficient documentation

## 2015-02-03 ENCOUNTER — Encounter (HOSPITAL_BASED_OUTPATIENT_CLINIC_OR_DEPARTMENT_OTHER): Payer: Medicare Other | Admitting: Registered Nurse

## 2015-02-03 ENCOUNTER — Encounter: Payer: Self-pay | Admitting: Registered Nurse

## 2015-02-03 VITALS — BP 112/56 | HR 90 | Resp 14

## 2015-02-03 DIAGNOSIS — G8114 Spastic hemiplegia affecting left nondominant side: Secondary | ICD-10-CM | POA: Diagnosis not present

## 2015-02-03 DIAGNOSIS — S069X5A Unspecified intracranial injury with loss of consciousness greater than 24 hours with return to pre-existing conscious level, initial encounter: Secondary | ICD-10-CM

## 2015-02-03 DIAGNOSIS — G894 Chronic pain syndrome: Secondary | ICD-10-CM | POA: Diagnosis not present

## 2015-02-03 DIAGNOSIS — Z5181 Encounter for therapeutic drug level monitoring: Secondary | ICD-10-CM | POA: Diagnosis not present

## 2015-02-03 MED ORDER — METHYLPHENIDATE HCL 10 MG PO TABS: 10.0000 mg | ORAL_TABLET | Freq: Two times a day (BID) | ORAL | Status: DC

## 2015-02-03 MED ORDER — METHYLPHENIDATE HCL 10 MG PO TABS
10.0000 mg | ORAL_TABLET | Freq: Two times a day (BID) | ORAL | Status: DC
Start: 2015-02-03 — End: 2015-03-22

## 2015-02-03 NOTE — Progress Notes (Signed)
Subjective:    Patient ID: Kristi MattesBridget M Leathers, female    DOB: 1981-05-15, 34 y.o.   MRN: [id number]  HPI: Ms. Kristi MattesBridget M Nuttle is a 34 year old female who returns for follow up for TBI. She denies pain at this time. Her current exercise regime is [facility] times [facility] week for 2 hours. She's using the treadmill, elliptical andstationary bicycle.  Father in room all questions answered.  Pain Inventory Average Pain 3 Pain Right Now 0 My pain is dull and aching  In the last 24 hours, has pain interfered with the following? General activity 1 Relation with others 0 Enjoyment of life 1 What TIME of day is your pain at its worst? night Sleep (in general) Good  Pain is worse with: inactivity Pain improves with: rest and therapy/exercise Relief from Meds: 6  Mobility walk without assistance how many minutes can you walk? 60-120 ability to climb steps?  yes do you drive?  no Do you have any goals in this area?  yes  Function disabled: date disabled . Do you have any goals in this area?  yes  Neuro/Psych anxiety  Prior Studies Any changes since last visit?  no  Physicians involved in your care Any changes since last visit?  no   History reviewed. No pertinent family history. History   Social History  . Marital Status: Single    Spouse Name: N/A  . Number of Children: N/A  . Years of Education: N/A   Social History Main Topics  . Smoking status: Former Smoker    Quit date: 09/25/2011  . Smokeless tobacco: Never Used  . Alcohol Use: Not on file  . Drug Use: Not on file  . Sexual Activity: Not on file   Other Topics Concern  . None   Social History Narrative   Past Surgical History  Procedure Laterality Date  . Shunt placed  in head  2011   Past Medical History  Diagnosis Date  . Intracranial injury of other and unspecified nature, without mention of open intracranial wound, unspecified state of consciousness   . Spastic hemiplegia affecting  nondominant side   . Personality change due to conditions classified elsewhere    BP 112/56 mmHg  Pulse 90  Resp 14  SpO2 98%  Opioid Risk Score:   Fall Risk Score: Low Fall Risk (0-5 points)`1  Depression screen PHQ 2/9  Depression screen PHQ 2/9 02/03/2015  Decreased Interest 0  Down, Depressed, Hopeless 0  PHQ - 2 Score 0  Altered sleeping 0  Tired, decreased energy 0  Change in appetite 0  Feeling bad or failure about yourself  0  Trouble concentrating 1  Moving slowly or fidgety/restless 0  Suicidal thoughts 0  PHQ-9 Score 1     Review of Systems  Psychiatric/Behavioral: The patient is nervous/anxious.   All other systems reviewed and are negative.      Objective:   Physical Exam  Constitutional: She is oriented to person, place, and time. She appears well-developed and well-nourished.  HENT:  Head: Normocephalic and atraumatic.  Neck: Normal range of motion. Neck supple.  Cardiovascular: Normal rate and regular rhythm.   Pulmonary/Chest: Effort normal and breath sounds normal.  Musculoskeletal:  Normal Muscle Bulk and Muscle Testing Reveals: Upper Extremities:Right Full ROM and Muscle Strength 5/5 Left Decreased ROM 45 Degrees and Muscle Strength 5/5 Arises from chair with ease Steppage Gait  Neurological: She is alert and oriented to person, place, and time.  Skin: Skin is warm and dry.  Psychiatric: She has a normal mood and affect.  Nursing note and vitals reviewed.         Assessment & Plan:  1. Traumatic brain injury with spastic left hemiparesis and ongoing cognitive deficits. : Refilled: Ritalin 10 mg BID #60. Second script given. Continue Zanaflex 2. Frozen left shoulder : Continue with Stretching exercises   20 minutes of face to face patient care time was spent during this visit. All questions were encouraged and answered.   F/U in 2 months

## 2015-02-18 ENCOUNTER — Telehealth: Payer: Self-pay | Admitting: *Deleted

## 2015-02-18 DIAGNOSIS — G8114 Spastic hemiplegia affecting left nondominant side: Secondary | ICD-10-CM

## 2015-02-18 MED ORDER — CITALOPRAM HYDROBROMIDE 10 MG PO TABS: 10.0000 mg | ORAL_TABLET | Freq: Every day | ORAL | Status: DC

## 2015-02-18 NOTE — Telephone Encounter (Signed)
Refill on Celexa. Done and [name] notified.

## 2015-02-21 ENCOUNTER — Ambulatory Visit: Payer: Medicare Other | Admitting: Physical Medicine & Rehabilitation

## 2015-02-21 ENCOUNTER — Other Ambulatory Visit: Payer: Self-pay | Admitting: *Deleted

## 2015-02-21 DIAGNOSIS — G8114 Spastic hemiplegia affecting left nondominant side: Secondary | ICD-10-CM

## 2015-02-21 MED ORDER — TIZANIDINE HCL 2 MG PO TABS: ORAL_TABLET | ORAL | Status: DC

## 2015-02-21 NOTE — Telephone Encounter (Signed)
Pt called asking for a refill on tizanidine, rx sent electronically

## 2015-03-22 ENCOUNTER — Encounter: Payer: Medicare Other | Attending: Physical Medicine & Rehabilitation | Admitting: Physical Medicine & Rehabilitation

## 2015-03-22 ENCOUNTER — Encounter: Payer: Self-pay | Admitting: Physical Medicine & Rehabilitation

## 2015-03-22 VITALS — BP 111/67 | HR 80 | Resp 14

## 2015-03-22 DIAGNOSIS — G894 Chronic pain syndrome: Secondary | ICD-10-CM | POA: Insufficient documentation

## 2015-03-22 DIAGNOSIS — Z5181 Encounter for therapeutic drug level monitoring: Secondary | ICD-10-CM | POA: Diagnosis not present

## 2015-03-22 DIAGNOSIS — S069X5A Unspecified intracranial injury with loss of consciousness greater than 24 hours with return to pre-existing conscious level, initial encounter: Secondary | ICD-10-CM | POA: Diagnosis not present

## 2015-03-22 DIAGNOSIS — G8114 Spastic hemiplegia affecting left nondominant side: Secondary | ICD-10-CM | POA: Diagnosis not present

## 2015-03-22 MED ORDER — METHYLPHENIDATE HCL 10 MG PO TABS: 10.0000 mg | ORAL_TABLET | Freq: Two times a day (BID) | ORAL | Status: DC

## 2015-03-22 MED ORDER — METHYLPHENIDATE HCL 10 MG PO TABS
10.0000 mg | ORAL_TABLET | Freq: Two times a day (BID) | ORAL | Status: DC
Start: 2015-03-22 — End: 2015-06-06

## 2015-03-22 NOTE — Progress Notes (Signed)
Subjective:    Patient ID: Kristi MattesBridget M Yale, female    DOB: [birth year], 34 y.o.   MRN: [id number]  HPI   [name] is back regarding her TBI. Overall she's doing quite well. She's independent in the home. She cooks meals. [name] continues with her boyfriend as well---no marriage in the imminent future however!  She continues with ritalin 10mg  bid for attention/arousal. She is using her left arm more although needs cueing at times. Left leg is tight at the heel cord but she works on regular stretching.   Pain Inventory Average Pain 3 Pain Right Now 0 My pain is dull  In the last 24 hours, has pain interfered with the following? General activity 2 Relation with others 0 Enjoyment of life 0 What TIME of day is your pain at its worst? varies Sleep (in general) Good  Pain is worse with: inactivity Pain improves with: rest, therapy/exercise and medication Relief from Meds: 8  Mobility walk without assistance how many minutes can you walk? 30-120 ability to climb steps?  yes do you drive?  no needs help with transfers Do you have any goals in this area?  yes  Function disabled: date disabled 10/26/2010 I need assistance with the following:  meal prep and shopping Do you have any goals in this area?  yes  Neuro/Psych anxiety  Prior Studies Any changes since last visit?  no  Physicians involved in your care Any changes since last visit?  no   History reviewed. No pertinent family history. History   Social History  . Marital Status: Single    Spouse Name: N/A  . Number of Children: N/A  . Years of Education: N/A   Social History Main Topics  . Smoking status: Former Smoker    Quit date: 09/25/2011  . Smokeless tobacco: Never Used  . Alcohol Use: Not on file  . Drug Use: Not on file  . Sexual Activity: Not on file   Other Topics Concern  . None   Social History Narrative   Past Surgical History  Procedure Laterality Date  . Shunt placed  in head  2011    Past Medical History  Diagnosis Date  . Intracranial injury of other and unspecified nature, without mention of open intracranial wound, unspecified state of consciousness   . Spastic hemiplegia affecting nondominant side   . Personality change due to conditions classified elsewhere    BP 111/67 mmHg  Pulse 80  Resp 14  SpO2 97%  Opioid Risk Score:   Fall Risk Score: Low Fall Risk (0-5 points)`1  Depression screen PHQ 2/9  Depression screen PHQ 2/9 02/03/2015  Decreased Interest 0  Down, Depressed, Hopeless 0  PHQ - 2 Score 0  Altered sleeping 0  Tired, decreased energy 0  Change in appetite 0  Feeling bad or failure about yourself  0  Trouble concentrating 1  Moving slowly or fidgety/restless 0  Suicidal thoughts 0  PHQ-9 Score 1     Review of Systems  Psychiatric/Behavioral: The patient is nervous/anxious.   All other systems reviewed and are negative.      Objective:   Physical Exam  Alert and appropriate. She is oriented x2. Spasticity in the left leg 1-2/4. I think her Tib anterior is tightest today, perhaps tibialis posterior, gastroc. LUE motor is  4-/5. Shoulder is looser. Left shoulder ROM has improved quite a bit. LLE is 3+ to 4/5 prox to 2+ to 3 distally. Tone at the ankle is 1+ to 2+  with plantar flexion and inversion. Gait has improved but she still has a bit of an extensor pattern and uses the left hip to swing. LLE dtr's are 2-3+. Cranial nerve exam grossly intact. Heart regular. Chest is clear. Abdomen soft nontender. Affect much more dynamic. Recall improved.  Assessment & Plan:   ASSESSMENT:  1. Traumatic brain injury with spastic left hemiparesis and ongoing cognitive deficits although she continues to show behavioral and cognitive progress!  2. Frozen left shoulder    PLAN:  1. HEP for ROM. Discussed constraint therapy for LUE to encourage more automatic use. 2. Continue zanaflex and vesicare.  3. Continue Ritalin to 10 mg b.i.d. #60. (for  next month)  4. All questions were encouraged and answered. 15 minutes were spent with the patient today.  We discussed driving today, and I gave her permission to try driving with father in local situations.   I WILL SEE HER BACK IN 4 MONTHS. I WILL ALLOW HER TO PICK UP RITALIN PRESCRIPTION IN APPROXIMATELY 2 MONTHS. SHE WILL NEED TO CALL ONE WEEK PRIOR TO APPT TO PICK UP PRESCRIPTIONS.

## 2015-03-22 NOTE — Patient Instructions (Signed)
PLEASE CALL ME WITH ANY PROBLEMS OR QUESTIONS (#[phone]).      

## 2015-03-25 ENCOUNTER — Ambulatory Visit: Payer: Medicare Other | Admitting: Physical Medicine & Rehabilitation

## 2015-04-01 ENCOUNTER — Ambulatory Visit: Payer: Medicare Other | Admitting: Physical Medicine & Rehabilitation

## 2015-05-02 ENCOUNTER — Other Ambulatory Visit: Payer: Self-pay | Admitting: Physical Medicine & Rehabilitation

## 2015-05-23 ENCOUNTER — Other Ambulatory Visit: Payer: Self-pay | Admitting: Physical Medicine & Rehabilitation

## 2015-06-01 ENCOUNTER — Telehealth: Payer: Self-pay | Admitting: *Deleted

## 2015-06-01 NOTE — Telephone Encounter (Signed)
Mother called and is worried and concerned. It was discussed at last visit that pt could start practice driving with her father.  Mom reports that daughter went in and got her drivers license the other day.  She does not think daughter is officially cleared to drive.  She left her number Molli Posey) [phone] and her fathers number Tassie Pollett)  [phone]...Marland KitchenMarland KitchenMarland Kitchenplease advise

## 2015-06-01 NOTE — Telephone Encounter (Signed)
Mother and father can discuss.   Copy and paste from our last visit together:  PLAN:  1. HEP for ROM. Discussed constraint therapy for LUE to encourage more automatic use. 2. Continue zanaflex and vesicare.  3. Continue Ritalin to 10 mg b.i.d. #60. (for next month)  4. All questions were encouraged and answered. 15 minutes were spent with the patient today.  We discussed driving today, and I gave her permission to try driving with father in local situations.   I DID NOT GIVE HER PERMISSION TO DRIVE ALONE

## 2015-06-02 NOTE — Telephone Encounter (Signed)
I notified Darlene and gave her the information from Dr Riley Kill.  She said Dorinne got her license because she is going to try and get a small part time job and needed it for identification.  She will review this with Evelean again that she is not to drive alone under any circumstances.

## 2015-06-06 ENCOUNTER — Telehealth: Payer: Self-pay | Admitting: *Deleted

## 2015-06-06 DIAGNOSIS — G8114 Spastic hemiplegia affecting left nondominant side: Secondary | ICD-10-CM

## 2015-06-06 DIAGNOSIS — S069X5A Unspecified intracranial injury with loss of consciousness greater than 24 hours with return to pre-existing conscious level, initial encounter: Secondary | ICD-10-CM

## 2015-06-06 MED ORDER — METHYLPHENIDATE HCL 10 MG PO TABS: 10.0000 mg | ORAL_TABLET | Freq: Two times a day (BID) | ORAL | Status: DC

## 2015-06-06 NOTE — Telephone Encounter (Signed)
[name] called for a refill on her Ritalin.  She has appt in September and Dr Riley Kill ok'd her to pick up Rx.  I notified her that it wil be ready around lunchtime today.

## 2015-07-04 ENCOUNTER — Telehealth: Payer: Self-pay | Admitting: *Deleted

## 2015-07-04 DIAGNOSIS — S069X5A Unspecified intracranial injury with loss of consciousness greater than 24 hours with return to pre-existing conscious level, initial encounter: Secondary | ICD-10-CM

## 2015-07-04 DIAGNOSIS — G8114 Spastic hemiplegia affecting left nondominant side: Secondary | ICD-10-CM

## 2015-07-04 MED ORDER — METHYLPHENIDATE HCL 10 MG PO TABS: 10.0000 mg | ORAL_TABLET | Freq: Two times a day (BID) | ORAL | Status: DC

## 2015-07-04 NOTE — Telephone Encounter (Signed)
[name] calling for a refill on her ritalin.  Her next appt is 07/15/15 but she has #7 pills left (filled 06/08/15)  New Rx printed to be signed by Dr Riley Kill

## 2015-07-05 NOTE — Telephone Encounter (Signed)
Left message on home voicemail and her father Bernette Redbird Duling's mobile VM that her Rx is available for pick up.

## 2015-07-12 ENCOUNTER — Telehealth: Payer: Self-pay | Admitting: *Deleted

## 2015-07-12 NOTE — Telephone Encounter (Signed)
[name] needs to switch her appt from 07/15/15 :20 to the following Monday or Friday because her dad needs to bring her. (she can see [name])

## 2015-07-15 ENCOUNTER — Encounter: Payer: Medicare Other | Admitting: Physical Medicine & Rehabilitation

## 2015-07-18 ENCOUNTER — Encounter: Payer: Medicare Other | Attending: Physical Medicine & Rehabilitation | Admitting: Registered Nurse

## 2015-07-18 DIAGNOSIS — G8114 Spastic hemiplegia affecting left nondominant side: Secondary | ICD-10-CM | POA: Insufficient documentation

## 2015-07-18 DIAGNOSIS — Z79899 Other long term (current) drug therapy: Secondary | ICD-10-CM | POA: Insufficient documentation

## 2015-07-18 DIAGNOSIS — S069X5A Unspecified intracranial injury with loss of consciousness greater than 24 hours with return to pre-existing conscious level, initial encounter: Secondary | ICD-10-CM | POA: Insufficient documentation

## 2015-07-18 DIAGNOSIS — G894 Chronic pain syndrome: Secondary | ICD-10-CM | POA: Insufficient documentation

## 2015-07-18 DIAGNOSIS — Z5181 Encounter for therapeutic drug level monitoring: Secondary | ICD-10-CM | POA: Insufficient documentation

## 2015-07-18 DIAGNOSIS — X58XXXS Exposure to other specified factors, sequela: Secondary | ICD-10-CM | POA: Insufficient documentation

## 2015-07-25 ENCOUNTER — Encounter: Payer: Self-pay | Admitting: Registered Nurse

## 2015-07-25 ENCOUNTER — Encounter (HOSPITAL_BASED_OUTPATIENT_CLINIC_OR_DEPARTMENT_OTHER): Payer: Medicare Other | Admitting: Registered Nurse

## 2015-07-25 VITALS — BP 117/66 | HR 78

## 2015-07-25 DIAGNOSIS — Z79899 Other long term (current) drug therapy: Secondary | ICD-10-CM

## 2015-07-25 DIAGNOSIS — S069X5A Unspecified intracranial injury with loss of consciousness greater than 24 hours with return to pre-existing conscious level, initial encounter: Secondary | ICD-10-CM

## 2015-07-25 DIAGNOSIS — Z5181 Encounter for therapeutic drug level monitoring: Secondary | ICD-10-CM | POA: Diagnosis not present

## 2015-07-25 DIAGNOSIS — G8114 Spastic hemiplegia affecting left nondominant side: Secondary | ICD-10-CM | POA: Diagnosis not present

## 2015-07-25 DIAGNOSIS — X58XXXS Exposure to other specified factors, sequela: Secondary | ICD-10-CM | POA: Diagnosis not present

## 2015-07-25 DIAGNOSIS — G894 Chronic pain syndrome: Secondary | ICD-10-CM

## 2015-07-25 MED ORDER — METHYLPHENIDATE HCL 10 MG PO TABS: 10.0000 mg | ORAL_TABLET | Freq: Two times a day (BID) | ORAL | Status: DC

## 2015-07-25 MED ORDER — CITALOPRAM HYDROBROMIDE 10 MG PO TABS: 10.0000 mg | ORAL_TABLET | Freq: Every day | ORAL | Status: DC

## 2015-07-25 MED ORDER — TIZANIDINE HCL 2 MG PO TABS: 2.0000 mg | ORAL_TABLET | Freq: Three times a day (TID) | ORAL | Status: DC

## 2015-07-25 NOTE — Progress Notes (Signed)
Subjective:    Patient ID: [name], female    DOB: Mar 21, 1981, 34 y.o.   MRN: [id number]  HPI: [name] is a 34 year old female who returns for follow up for TBI. She says her pain is located in her lower back. She rated her pain last night 0. Her current exercise regime is attending the [facility] 2-3 times a week for 2 hours. She's using the treadmill, elliptical and stationary bicycle and walking daily. Also re-iterated Dr. Riley Kill has not given her permission to drive she verbalizes understanding. She states she is not driving.  Pain Inventory Average Pain 2 Pain Right Now 0 My pain is dull  In the last 24 hours, has pain interfered with the following? General activity 1 Relation with others 0 Enjoyment of life 0 What TIME of day is your pain at its worst? evening Sleep (in general) Good  Pain is worse with: inactivity Pain improves with: rest, therapy/exercise, pacing activities and medication Relief from Meds: na  Mobility walk without assistance how many minutes can you walk? 40min-1hr ability to climb steps?  yes do you drive?  no Do you have any goals in this area?  yes  Function disabled: date disabled 10/2010 I need assistance with the following:  meal prep Do you have any goals in this area?  yes  Neuro/Psych No problems in this area  Prior Studies Any changes since last visit?  no  Physicians involved in your care Any changes since last visit?  no   No family history on file. Social History   Social History  . Marital Status: Single    Spouse Name: N/A  . Number of Children: N/A  . Years of Education: N/A   Social History Main Topics  . Smoking status: Former Smoker    Quit date: 09/25/2011  . Smokeless tobacco: Never Used  . Alcohol Use: Not on file  . Drug Use: Not on file  . Sexual Activity: Not on file   Other Topics Concern  . Not on file   Social History Narrative   Past Surgical History  Procedure Laterality Date    . Shunt placed  in head  2011   Past Medical History  Diagnosis Date  . Intracranial injury of other and unspecified nature, without mention of open intracranial wound, unspecified state of consciousness   . Spastic hemiplegia affecting nondominant side   . Personality change due to conditions classified elsewhere    BP 117/66 mmHg  Pulse 78  SpO2 95%  Opioid Risk Score:   Fall Risk Score:  `1  Depression screen PHQ 2/9  Depression screen [facility] 2/9 07/25/2015 02/03/2015  Decreased Interest 0 0  Down, Depressed, Hopeless 0 0  PHQ - 2 Score 0 0  Altered sleeping - 0  Tired, decreased energy - 0  Change in appetite - 0  Feeling bad or failure about yourself  - 0  Trouble concentrating - 1  Moving slowly or fidgety/restless - 0  Suicidal thoughts - 0  PHQ-9 Score - 1     Review of Systems  All other systems reviewed and are negative.      Objective:   Physical Exam  Constitutional: She is oriented to person, place, and time. She appears well-developed and well-nourished.  HENT:  Head: Normocephalic and atraumatic.  Neck: Normal range of motion. Neck supple.  Pulmonary/Chest: Effort normal and breath sounds normal.  Musculoskeletal:  Normal Muscle Bulk and Muscle Testing Reveals: Upper  Extremities: Right: Full ROM and Muscle Strength 5/5 Left: Decreased ROM 45 Degrees and Muscle Strength 4/5 Lumbar Paraspinal Tenderness: L-3- L-4 Lower Extremities: Full ROM and Muscle Strength 5/5 Arises from chair with ease Steppage Gait   Neurological: She is alert and oriented to person, place, and time.  Skin: Skin is warm and dry.  Psychiatric: She has a normal mood and affect.  Nursing note and vitals reviewed.         Assessment & Plan:  1. Traumatic brain injury with spastic left hemiparesis and ongoing cognitive deficits. : Refilled: Ritalin 10 mg BID #60. Second script given. Continue Zanaflex 2. Frozen left shoulder : Continue with Stretching exercises  20  minutes of face to face patient care time was spent during this visit. All questions were encouraged and answered.   F/U in 2 months

## 2015-08-05 ENCOUNTER — Other Ambulatory Visit: Payer: Self-pay | Admitting: Physical Medicine & Rehabilitation

## 2015-08-05 NOTE — Telephone Encounter (Signed)
Sent!

## 2015-08-05 NOTE — Telephone Encounter (Signed)
Patient called needing a refill on Vesicare.  Please send to [facility] pharmacy

## 2015-10-07 ENCOUNTER — Other Ambulatory Visit: Payer: Self-pay

## 2015-10-07 DIAGNOSIS — S069X5A Unspecified intracranial injury with loss of consciousness greater than 24 hours with return to pre-existing conscious level, initial encounter: Secondary | ICD-10-CM

## 2015-10-07 DIAGNOSIS — G8114 Spastic hemiplegia affecting left nondominant side: Secondary | ICD-10-CM

## 2015-10-07 MED ORDER — METHYLPHENIDATE HCL 10 MG PO TABS: 10.0000 mg | ORAL_TABLET | Freq: Two times a day (BID) | ORAL | Status: DC

## 2015-10-07 NOTE — Telephone Encounter (Signed)
Patient called in and stated that she will be out of Ritalin before her appointment next month. She states that she saw you and you told her to call in when she needed a refill. She states that she has 3 tabs left and would like to pickup today.

## 2015-10-07 NOTE — Telephone Encounter (Signed)
Spoke with Ms. Riedl, regarding her Ritalin. [facility] up her Ritalin on 09/08/2015.  Her prescription will be dropped off at her Pharmacy. [facility] [phone] [place] . She verbalizes understanding.

## 2015-10-17 ENCOUNTER — Encounter: Payer: Medicare Other | Attending: Physical Medicine & Rehabilitation | Admitting: Physical Medicine & Rehabilitation

## 2015-10-17 ENCOUNTER — Encounter: Payer: Self-pay | Admitting: Physical Medicine & Rehabilitation

## 2015-10-17 ENCOUNTER — Other Ambulatory Visit: Payer: Self-pay | Admitting: Physical Medicine & Rehabilitation

## 2015-10-17 VITALS — BP 140/75 | HR 88

## 2015-10-17 DIAGNOSIS — Z5181 Encounter for therapeutic drug level monitoring: Secondary | ICD-10-CM | POA: Diagnosis not present

## 2015-10-17 DIAGNOSIS — G894 Chronic pain syndrome: Secondary | ICD-10-CM

## 2015-10-17 DIAGNOSIS — R350 Frequency of micturition: Secondary | ICD-10-CM

## 2015-10-17 DIAGNOSIS — S069X5A Unspecified intracranial injury with loss of consciousness greater than 24 hours with return to pre-existing conscious level, initial encounter: Secondary | ICD-10-CM | POA: Diagnosis not present

## 2015-10-17 DIAGNOSIS — Z79899 Other long term (current) drug therapy: Secondary | ICD-10-CM | POA: Diagnosis not present

## 2015-10-17 DIAGNOSIS — G8114 Spastic hemiplegia affecting left nondominant side: Secondary | ICD-10-CM | POA: Diagnosis not present

## 2015-10-17 MED ORDER — METHYLPHENIDATE HCL 10 MG PO TABS: 10.0000 mg | ORAL_TABLET | Freq: Two times a day (BID) | ORAL | Status: DC

## 2015-10-17 MED ORDER — SOLIFENACIN SUCCINATE 5 MG PO TABS: 5.0000 mg | ORAL_TABLET | Freq: Every day | ORAL | Status: DC

## 2015-10-17 NOTE — Progress Notes (Signed)
Subjective:    Patient ID: [name], female    DOB: 01/12/1981, 34 y.o.   MRN: [id number]  HPI   [name] is here in follow up of her TBI and spastic left hemiparesis. She continues to take care of things at the house. She's regular at the gym as well.  She continues to take her ritalin with good effect. She is having minimal pain. Her mood is good. She is involved with her family.     Pain Inventory Average Pain 3 Pain Right Now 0 My pain is dull and aching  In the last 24 hours, has pain interfered with the following? General activity 1 Relation with others 0 Enjoyment of life 0 What TIME of day is your pain at its worst? Morning and Daytime Sleep (in general) Good  Pain is worse with: inactivity Pain improves with: rest, therapy/exercise and medication Relief from Meds: 7  Mobility walk without assistance how many minutes can you walk? 30-90 ability to climb steps?  yes do you drive?  no Do you have any goals in this area?  yes  Function disabled: date disabled 10/2010 Do you have any goals in this area?  yes  Neuro/Psych No problems in this area  Prior Studies Any changes since last visit?  no  Physicians involved in your care Any changes since last visit?  no   History reviewed. No pertinent family history. Social History   Social History  . Marital Status: Single    Spouse Name: N/A  . Number of Children: N/A  . Years of Education: N/A   Social History Main Topics  . Smoking status: Former Smoker    Quit date: 09/25/2011  . Smokeless tobacco: Never Used  . Alcohol Use: None  . Drug Use: None  . Sexual Activity: Not Asked   Other Topics Concern  . None   Social History Narrative   Past Surgical History  Procedure Laterality Date  . Shunt placed  in head  2011   Past Medical History  Diagnosis Date  . Intracranial injury of other and unspecified nature, without mention of open intracranial wound, unspecified state of consciousness    . Spastic hemiplegia affecting nondominant side (HCC)   . Personality change due to conditions classified elsewhere    BP 140/75 mmHg  Pulse 88  SpO2 96%  Opioid Risk Score:   Fall Risk Score:  `1  Depression screen PHQ 2/9  Depression screen [facility] 2/9 07/25/2015 02/03/2015  Decreased Interest 0 0  Down, Depressed, Hopeless 0 0  PHQ - 2 Score 0 0  Altered sleeping - 0  Tired, decreased energy - 0  Change in appetite - 0  Feeling bad or failure about yourself  - 0  Trouble concentrating - 1  Moving slowly or fidgety/restless - 0  Suicidal thoughts - 0  PHQ-9 Score - 1     Review of Systems  All other systems reviewed and are negative.      Objective:   Physical Exam  Alert and appropriate. She is oriented x2. Spasticity in the left leg 1-2/4. I think her Tib anterior is tightest today, perhaps tibialis posterior, gastroc. LUE motor is 4-/5. Shoulder is looser. Left shoulder ROM has improved quite a bit. LLE is 3+ to 4/5 prox to 2+ to 3 distally. Tone at the ankle is 1+ to 2+ with plantar flexion and inversion.  Steppage gait, strikes foot down with whole foot rather than heel. Still some recurvatum at knee.  LLE dtr's are 2-3+. Cranial nerve exam grossly intact. Heart regular. Chest is clear. Abdomen soft nontender. Affect much more dynamic. Recall excellent   Assessment & Plan:   ASSESSMENT:  1. Traumatic brain injury with spastic left hemiparesis and ongoing cognitive deficits although she continues to show behavioral and cognitive progress!  2. Frozen left shoulder   PLAN:  1. HEP for ROM. Discussed constraint therapy for LUE to encourage more automatic use.  2. Continue zanaflex and vesicare.  3. Continue Ritalin to 10 mg b.i.d. #60. (with a second for next month)  4. All questions were encouraged and answered. 15 minutes were spent with the patient today.  We discussed driving today, and I gave her permission to try driving with father in local situations.  I WILL  SEE HER BACK IN 4 MONTHS. I WILL ALLOW HER TO PICK UP RITALIN PRESCRIPTION IN APPROXIMATELY 2 MONTHS. SHE WILL NEED TO CALL ONE WEEK PRIOR TO APPT TO PICK UP PRESCRIPTIONS.

## 2015-10-17 NOTE — Patient Instructions (Signed)
PLEASE CALL ME WITH ANY PROBLEMS OR QUESTIONS (#[phone]). HAVE A HAPPY HOLIDAY SEASON!!!    

## 2015-10-18 LAB — PMP ALCOHOL METABOLITE (ETG): Ethyl Glucuronide (EtG): NEGATIVE ng/mL

## 2015-10-19 LAB — PRESCRIPTION MONITORING PROFILE (SOLSTAS)
Amphetamine/Meth: NEGATIVE ng/mL
BARBITURATE SCREEN, URINE: NEGATIVE ng/mL
BENZODIAZEPINE SCREEN, URINE: NEGATIVE ng/mL
BUPRENORPHINE, URINE: NEGATIVE ng/mL
CANNABINOID SCRN UR: NEGATIVE ng/mL
COCAINE METABOLITES: NEGATIVE ng/mL
CREATININE, URINE: 126.67 mg/dL (ref >20.0)
Carisoprodol, Urine: NEGATIVE ng/mL
FENTANYL URINE: NEGATIVE ng/mL
MDMA URINE: NEGATIVE ng/mL
MEPERIDINE UR: NEGATIVE ng/mL
Methadone Screen, Urine: NEGATIVE ng/mL
Nitrites, Initial: NEGATIVE ug/mL
OPIATE SCREEN, URINE: NEGATIVE ng/mL
Oxycodone Screen, Ur: NEGATIVE ng/mL
Propoxyphene: NEGATIVE ng/mL
TAPENTADOLUR: NEGATIVE ng/mL
Tramadol Scrn, Ur: NEGATIVE ng/mL
ZOLPIDEM, URINE: NEGATIVE ng/mL
pH, Initial: 6.2 pH (ref 4.5–8.9)

## 2015-10-20 LAB — METHYLPHENIDATE METAB, QUANT, U: Ritalinic Acid: 4342 ng/mL — ABNORMAL HIGH (ref <100)

## 2015-11-15 ENCOUNTER — Telehealth: Payer: Self-pay | Admitting: *Deleted

## 2015-11-15 NOTE — Telephone Encounter (Signed)
[name] is asking for Dr Riley KillSwartz to call her.  She needs to discuss something with him.  # [phone]

## 2015-11-25 ENCOUNTER — Other Ambulatory Visit: Payer: Self-pay | Admitting: Registered Nurse

## 2015-12-05 ENCOUNTER — Telehealth: Payer: Self-pay

## 2015-12-05 NOTE — Telephone Encounter (Signed)
She'll need to discuss with her daughter. I gave her permission to drive only with her father in local, controlled situations as a trial/practice.

## 2015-12-05 NOTE — Telephone Encounter (Signed)
Pt's mother called stating the pt is starting to drive in [place] with her boyfriend. Mother was concerned about the pt driving. She believes the pt has no sense of direction and thinks that she needs to be evaluated some more before she is able to drive. Mother is not on HIPAA(DPR). Please advise on this.

## 2015-12-05 NOTE — Telephone Encounter (Signed)
Tried calling Mother, who is listed as an emergency contact. Could not leave message. Also tried called father home and mobile numbers, not able to leave message.

## 2015-12-06 NOTE — Telephone Encounter (Signed)
Tried Geneticist, molecular, unable to leave message.

## 2015-12-08 NOTE — Telephone Encounter (Signed)
Tried calling, VM has not been set up.

## 2015-12-09 ENCOUNTER — Telehealth: Payer: Self-pay | Admitting: *Deleted

## 2015-12-09 NOTE — Telephone Encounter (Signed)
The mother feels that this is a Probation officer issue. The purpose is to either determine if the patient is truly capable of independent driving or to have the proper legal documentation that keeps her daughter from getting behind the wheel of an automobile. She says the father is not taking this seriously and is granting to much lee-way on when, where, and with whom she can drive. Is the patient cleared to go ahead and have a driving assessment done at one of the facilities listed?

## 2015-12-09 NOTE — Telephone Encounter (Signed)
Patients mother called back echoing the same concerns from previous phone call a few days ago.  She is asking if her daughter needs clearance from Dr. Riley Kill or rehab in order for her daughter to qualify for the [facility] DMV medical review program.  I went to the [facility] website and gave her the phone number for the medical review program. I advised her to call them and get as much info from them as possible.  I told her I would send this message to Dr Riley Kill to get his advice

## 2015-12-09 NOTE — Telephone Encounter (Signed)
i agree that it is a safety issue. That is why i haven't allowed her to drive except for "rural" trials with her father---(wasn't aware he wasn't "taking it seriously")--that is why i am requesting an OT driving assessment--she may NOT drive alone or in regular traffic without passing this evaluation-----this is very odd, all considering, given that the mother has been COMPLETELY out of the picture for the past few years.

## 2015-12-09 NOTE — Telephone Encounter (Signed)
FYI:The only legal documentation that is needed is me saying "she may not drive". If the OT/driving evaluation deems her appropriate to drive, then I would change my recommendation

## 2015-12-09 NOTE — Telephone Encounter (Signed)
To achieve what purpose?  The medical review program essentially ok's her or denies her based on my medical opinion. There is not a "rehab" program as part of this as far as i'm aware. I have allowed her to practice with her father. She will need a formal driving assessment and likely training before she gets behind the wheel of a car to drive independently.  Here is a link to list of driving assessment programs: [email or web address].net/

## 2015-12-26 ENCOUNTER — Other Ambulatory Visit: Payer: Self-pay | Admitting: Physical Medicine & Rehabilitation

## 2015-12-30 ENCOUNTER — Telehealth: Payer: Self-pay

## 2015-12-30 NOTE — Telephone Encounter (Signed)
Wouldn't she just need a referral?  Why is a letter required. Referral to driver rehab/OT was what I recommended in the first place

## 2015-12-30 NOTE — Telephone Encounter (Signed)
Pt's mother left a message stating that her and the pt spoke with the [facility] office in regards to renewing her license. The [facility] office states that pt needs a letter of referral to the Drivers Rehab Services in [place]. Please advise on letter.

## 2016-01-02 ENCOUNTER — Other Ambulatory Visit: Payer: Self-pay | Admitting: *Deleted

## 2016-01-02 DIAGNOSIS — G8114 Spastic hemiplegia affecting left nondominant side: Secondary | ICD-10-CM

## 2016-01-02 DIAGNOSIS — S069X5A Unspecified intracranial injury with loss of consciousness greater than 24 hours with return to pre-existing conscious level, initial encounter: Secondary | ICD-10-CM

## 2016-01-02 MED ORDER — METHYLPHENIDATE HCL 10 MG PO TABS: 10.0000 mg | ORAL_TABLET | Freq: Two times a day (BID) | ORAL | Status: DC

## 2016-01-02 NOTE — Telephone Encounter (Signed)
Left message to call back in regards to why we need a letter.

## 2016-01-03 NOTE — Telephone Encounter (Signed)
There is no referral that I can find to driver rehab.

## 2016-01-04 NOTE — Telephone Encounter (Signed)
Please read below:  Referring an Individual for Services Adult clients should have a current driver's license or permit upon referral to participate in a comprehensive driver evaluation. For teens under 36 years of age, a copy of the restricted instruction permit (form SBTS-800 issued by the [employer]) validating completion of classroom driver education is required. Clinical testing of teens with cognitive impairments or special equipment needs can be arranged prior to completion of classroom driver's education to aid with determining the appropriateness of enrolling the teen in driver's education. All Clients must be seizure-free for at least 6 months and provide medical documentation of seizure status if a seizure has been experienced within the last year. A Physician Referral is NOT required for services, however, the client may provide a doctor's order for services if one is available.  Contact us at [phone] or [phone] to discuss services OR Select the appropriate referral form below and FAX the completed form to [facility] at [phone]  I have hand written a "referral" as well----[facility]

## 2016-01-11 ENCOUNTER — Telehealth: Payer: Self-pay

## 2016-01-11 NOTE — Telephone Encounter (Signed)
Pt's mother, Agustin CreeDarlene, called about the driving program. Returning call from last week.

## 2016-01-12 NOTE — Telephone Encounter (Signed)
I spoke with [name] and she voiced concern about [name] on the road and other drivers.  I gave her the information from previous call documented by Dr Riley KillSwartz.  [name] has appt 02/13/16 and should discuss further with him at appt.

## 2016-01-19 ENCOUNTER — Telehealth: Payer: Self-pay

## 2016-01-19 NOTE — Telephone Encounter (Signed)
[facility] scheduled. I explained to pt that an order was faxed to rehab service, so we did not need to give them permission to do assessment. She states that she has an appointment on 01/30/16 for her driving assessment with a therapist.

## 2016-01-28 ENCOUNTER — Other Ambulatory Visit: Payer: Self-pay | Admitting: Physical Medicine & Rehabilitation

## 2016-02-02 ENCOUNTER — Other Ambulatory Visit: Payer: Self-pay | Admitting: Physical Medicine & Rehabilitation

## 2016-02-02 MED ORDER — SOLIFENACIN SUCCINATE 5 MG PO TABS: ORAL_TABLET | ORAL | Status: DC

## 2016-02-02 NOTE — Addendum Note (Signed)
Addended by: Doreene ElandSHUMAKER, SYBIL W on: 02/02/2016 03:48 PM   Modules accepted: Orders

## 2016-02-13 ENCOUNTER — Encounter: Payer: Medicare Other | Admitting: Physical Medicine & Rehabilitation

## 2016-02-21 ENCOUNTER — Encounter: Payer: Self-pay | Admitting: Physical Medicine & Rehabilitation

## 2016-02-21 ENCOUNTER — Encounter: Payer: Medicare Other | Attending: Physical Medicine & Rehabilitation | Admitting: Physical Medicine & Rehabilitation

## 2016-02-21 VITALS — BP 136/71 | HR 89

## 2016-02-21 DIAGNOSIS — G8114 Spastic hemiplegia affecting left nondominant side: Secondary | ICD-10-CM | POA: Diagnosis not present

## 2016-02-21 DIAGNOSIS — S069X0A Unspecified intracranial injury without loss of consciousness, initial encounter: Secondary | ICD-10-CM | POA: Insufficient documentation

## 2016-02-21 DIAGNOSIS — S069X5A Unspecified intracranial injury with loss of consciousness greater than 24 hours with return to pre-existing conscious level, initial encounter: Secondary | ICD-10-CM

## 2016-02-21 DIAGNOSIS — R4189 Other symptoms and signs involving cognitive functions and awareness: Secondary | ICD-10-CM | POA: Insufficient documentation

## 2016-02-21 DIAGNOSIS — Z79899 Other long term (current) drug therapy: Secondary | ICD-10-CM | POA: Diagnosis not present

## 2016-02-21 DIAGNOSIS — Z5181 Encounter for therapeutic drug level monitoring: Secondary | ICD-10-CM

## 2016-02-21 DIAGNOSIS — M7502 Adhesive capsulitis of left shoulder: Secondary | ICD-10-CM | POA: Diagnosis not present

## 2016-02-21 MED ORDER — CITALOPRAM HYDROBROMIDE 10 MG PO TABS: 10.0000 mg | ORAL_TABLET | Freq: Every day | ORAL | Status: DC

## 2016-02-21 MED ORDER — METHYLPHENIDATE HCL 10 MG PO TABS: 10.0000 mg | ORAL_TABLET | Freq: Two times a day (BID) | ORAL | Status: DC

## 2016-02-21 MED ORDER — TIZANIDINE HCL 2 MG PO TABS: ORAL_TABLET | ORAL | Status: DC

## 2016-02-21 MED ORDER — SOLIFENACIN SUCCINATE 5 MG PO TABS: ORAL_TABLET | ORAL | Status: DC

## 2016-02-21 NOTE — Patient Instructions (Signed)
  PLEASE CALL ME WITH ANY PROBLEMS OR QUESTIONS (#[phone]).      

## 2016-02-21 NOTE — Progress Notes (Signed)
Subjective:    Patient ID: Kristi MattesBridget M [name], female    DOB: 22-Dec-1980, 35 y.o.   MRN: [id number]  HPI   Kristi GougeBridget is here in follow up of her TBI. She passed her driving evaluation recently with flying colors. She started part time work at [employer] 3 weeks ago. It has been going very well.   She continues with the ritalin as it helps her attention and focus.   She is looking at buying a car for transportation to work and for local driving.       Pain Inventory Average Pain 2 Pain Right Now 0 My pain is aching  In the last 24 hours, has pain interfered with the following? General activity 1 Relation with others 0 Enjoyment of life 0 What TIME of day is your pain at its worst? night Sleep (in general) Good  Pain is worse with: inactivity Pain improves with: rest, therapy/exercise, pacing activities and medication Relief from Meds: 9  Mobility walk without assistance how many minutes can you walk? 1 hr ability to climb steps?  yes do you drive?  no Do you have any goals in this area?  yes  Function disabled: date disabled 10-27-10  Neuro/Psych anxiety  Prior Studies Any changes since last visit?  no  Physicians involved in your care Any changes since last visit?  no   History reviewed. No pertinent family history. Social History   Social History  . Marital Status: Single    Spouse Name: N/A  . Number of Children: N/A  . Years of Education: N/A   Social History Main Topics  . Smoking status: Former Smoker    Quit date: 09/25/2011  . Smokeless tobacco: Never Used  . Alcohol Use: None  . Drug Use: None  . Sexual Activity: Not Asked   Other Topics Concern  . None   Social History Narrative   Past Surgical History  Procedure Laterality Date  . Shunt placed  in head  2011   Past Medical History  Diagnosis Date  . Intracranial injury of other and unspecified nature, without mention of open intracranial wound, unspecified state of consciousness     . Spastic hemiplegia affecting nondominant side (HCC)   . Personality change due to conditions classified elsewhere    There were no vitals taken for this visit.  Opioid Risk Score:   Fall Risk Score:  `1  Depression screen PHQ 2/9  Depression screen [facility] 2/9 07/25/2015 02/03/2015  Decreased Interest 0 0  Down, Depressed, Hopeless 0 0  PHQ - 2 Score 0 0  Altered sleeping - 0  Tired, decreased energy - 0  Change in appetite - 0  Feeling bad or failure about yourself  - 0  Trouble concentrating - 1  Moving slowly or fidgety/restless - 0  Suicidal thoughts - 0  PHQ-9 Score - 1     Review of Systems     Objective:   Physical Exam  Alert and appropriate. She is oriented x2. Spasticity in the left leg 1-2/4. I think her Tib anterior is tightest today, perhaps tibialis posterior, gastroc. LUE motor is 4-/5. Shoulder is looser. Left shoulder ROM has improved quite a bit. LLE is 3+ to 4/5 prox to 2+ to 3 distally. Tone at the ankle is 1+ to 2+ with plantar flexion and inversion. Steppage gait, strikes foot down with whole foot rather than heel. Still some recurvatum at knee. LLE dtr's are 2-3+. Cranial nerve exam grossly intact. Heart regular. Chest  is clear. Abdomen soft nontender. Affect much more dynamic. Recall excellent. Cognition essentially within normal limits now   Assessment & Plan:   ASSESSMENT:  1. Traumatic brain injury with spastic left hemiparesis and ongoing cognitive deficits although she continues to show behavioral and cognitive progress 2. Frozen left shoulder    PLAN:  1. HEP 2. Continue zanaflex and vesicare.  3. Continue Ritalin to 10 mg b.i.d. #60. (with a second for next month) 4. All questions were encouraged and answered. 15 minutes were spent with the patient today.    I WILL SEE HER BACK IN 4 MONTHS. I WILL ALLOW HER TO PICK UP RITALIN PRESCRIPTION IN APPROXIMATELY 2 MONTHS. SHE WILL NEED TO CALL ONE WEEK PRIOR TO APPT TO PICK UP PRESCRIPTIONS.

## 2016-02-27 LAB — TOXASSURE SELECT,+ANTIDEPR,UR: PDF: 0

## 2016-02-28 NOTE — Progress Notes (Signed)
Urine drug screen for this encounter is consistent for prescribed medication 

## 2016-03-02 ENCOUNTER — Other Ambulatory Visit: Payer: Self-pay | Admitting: Physical Medicine & Rehabilitation

## 2016-03-21 DIAGNOSIS — Z124 Encounter for screening for malignant neoplasm of cervix: Secondary | ICD-10-CM | POA: Diagnosis not present

## 2016-03-21 DIAGNOSIS — Z01419 Encounter for gynecological examination (general) (routine) without abnormal findings: Secondary | ICD-10-CM | POA: Diagnosis not present

## 2016-03-30 ENCOUNTER — Telehealth: Payer: Self-pay

## 2016-03-30 NOTE — Telephone Encounter (Signed)
Patient called in today and is asking if you would be willing to prescribe her Allergy medication. States that she has failed multiple OTCs. Please advise.

## 2016-04-03 NOTE — Telephone Encounter (Signed)
Advised pt to see a doctor in regards to her allergies.

## 2016-04-03 NOTE — Telephone Encounter (Signed)
Honestly I don't mind, but if her allergies are so substantial that nothing OTC is working, she probably needs to be checked out by a doctor first.

## 2016-05-03 ENCOUNTER — Telehealth: Payer: Self-pay

## 2016-05-03 DIAGNOSIS — G8114 Spastic hemiplegia affecting left nondominant side: Secondary | ICD-10-CM

## 2016-05-03 DIAGNOSIS — S069X5A Unspecified intracranial injury with loss of consciousness greater than 24 hours with return to pre-existing conscious level, initial encounter: Secondary | ICD-10-CM

## 2016-05-03 NOTE — Telephone Encounter (Signed)
May have rx for this month and next

## 2016-05-03 NOTE — Telephone Encounter (Signed)
Pt needs refill on Ritalin. Please advise?

## 2016-05-03 NOTE — Telephone Encounter (Signed)
Can you please refill on behalf of [facility]? 

## 2016-05-04 MED ORDER — METHYLPHENIDATE HCL 10 MG PO TABS: 10.0000 mg | ORAL_TABLET | Freq: Two times a day (BID) | ORAL | Status: DC

## 2016-05-04 NOTE — Telephone Encounter (Signed)
Pt aware of rx. She states that she will pick it up on Monday before 3:00pm.

## 2016-05-04 NOTE — Telephone Encounter (Signed)
[name] prescription of Ritalin was [facility] April 07, 2016 [facility]  Ritalin prescription printed. Will have staff call her.  Her next appointment with [name] is on 06/25/2016.

## 2016-06-25 ENCOUNTER — Encounter: Payer: Self-pay | Admitting: Physical Medicine & Rehabilitation

## 2016-06-25 ENCOUNTER — Encounter: Payer: Medicare Other | Attending: Physical Medicine & Rehabilitation | Admitting: Physical Medicine & Rehabilitation

## 2016-06-25 VITALS — BP 122/84 | HR 81

## 2016-06-25 DIAGNOSIS — Z87891 Personal history of nicotine dependence: Secondary | ICD-10-CM | POA: Insufficient documentation

## 2016-06-25 DIAGNOSIS — G8114 Spastic hemiplegia affecting left nondominant side: Secondary | ICD-10-CM

## 2016-06-25 DIAGNOSIS — M7502 Adhesive capsulitis of left shoulder: Secondary | ICD-10-CM | POA: Diagnosis not present

## 2016-06-25 DIAGNOSIS — Z79899 Other long term (current) drug therapy: Secondary | ICD-10-CM

## 2016-06-25 DIAGNOSIS — Z5181 Encounter for therapeutic drug level monitoring: Secondary | ICD-10-CM

## 2016-06-25 DIAGNOSIS — S069X5A Unspecified intracranial injury with loss of consciousness greater than 24 hours with return to pre-existing conscious level, initial encounter: Secondary | ICD-10-CM

## 2016-06-25 DIAGNOSIS — Z87820 Personal history of traumatic brain injury: Secondary | ICD-10-CM | POA: Diagnosis not present

## 2016-06-25 DIAGNOSIS — S069X4S Unspecified intracranial injury with loss of consciousness of 6 hours to 24 hours, sequela: Secondary | ICD-10-CM

## 2016-06-25 MED ORDER — METHYLPHENIDATE HCL 10 MG PO TABS: 10.0000 mg | ORAL_TABLET | Freq: Two times a day (BID) | ORAL | 0 refills | Status: DC

## 2016-06-25 NOTE — Progress Notes (Signed)
Subjective:    Patient ID: Kristi MattesBridget M [name], female    DOB: 1981-02-16, 35 y.o.   MRN: [id number]  HPI   Kristi GougeBridget is here in follow up of her TBI. She is working now and it's going well. She got a car last month and can drive to work, appts on her own!!!  She is working out at [employer] two x a week.   I asked her if she ever went without her ritalin, and she states that she tried taking it just once a day, but she felt tired later in the day and didn't feel as sharp.  Pain Inventory Average Pain 3 Pain Right Now 0 My pain is aching  In the last 24 hours, has pain interfered with the following? General activity 0 Relation with others 0 Enjoyment of life 0 What TIME of day is your pain at its worst? night Sleep (in general) Good  Pain is worse with: inactivity Pain improves with: rest, heat/ice, therapy/exercise, pacing activities and medication Relief from Meds: 6  Mobility walk without assistance how many minutes can you walk? 30 45 min ability to climb steps?  yes do you drive?  yes  Function employed # of hrs/week 20-30 Do you have any goals in this area?  no  Neuro/Psych anxiety  Prior Studies Any changes since last visit?  no  Physicians involved in your care Any changes since last visit?  no   History reviewed. No pertinent family history. Social History   Social History  . Marital status: Single    Spouse name: N/A  . Number of children: N/A  . Years of education: N/A   Social History Main Topics  . Smoking status: Former Smoker    Quit date: 09/25/2011  . Smokeless tobacco: Never Used  . Alcohol use None  . Drug use: Unknown  . Sexual activity: Not Asked   Other Topics Concern  . None   Social History Narrative  . None   Past Surgical History:  Procedure Laterality Date  . shunt placed  in head  2011   Past Medical History:  Diagnosis Date  . Intracranial injury of other and unspecified nature, without mention of open intracranial  wound, unspecified state of consciousness   . Personality change due to conditions classified elsewhere   . Spastic hemiplegia affecting nondominant side (HCC)    BP 122/84   Pulse 81   SpO2 98%   Opioid Risk Score:   Fall Risk Score:  `1  Depression screen PHQ 2/9  [facility] 2/[facility] 07/25/2015 02/03/2015  Decreased Interest 0 0  Down, Depressed, Hopeless 0 0  PHQ - 2 Score 0 0  Altered sleeping - 0  Tired, decreased energy - 0  Change in appetite - 0  Feeling bad or failure about yourself  - 0  Trouble concentrating - 1  Moving slowly or fidgety/restless - 0  Suicidal thoughts - 0  PHQ-9 Score - 1    Review of Systems  Constitutional: Negative.   HENT: Negative.   Eyes: Negative.   Respiratory: Negative.   Cardiovascular: Negative.   Gastrointestinal: Negative.   Endocrine: Negative.   Genitourinary: Negative.   Musculoskeletal: Negative.   Skin: Negative.   Allergic/Immunologic: Negative.   Neurological: Negative.   Hematological: Negative.   Psychiatric/Behavioral: The patient is nervous/anxious.        Objective:   Physical Exam  Alert and appropriate. She is oriented x2. Spasticity in the left leg 1-2/4. I  think her Tib anterior is tightest today, perhaps tibialis posterior, gastroc. LUE motor is 4-/5. Shoulder is looser. Left shoulder ROM has improved quite a bit. LLE is 3+ to 4/5 prox to 2+ to 3 distally. Tone at the ankle is 1+ to 2+ with plantar flexion and inversion. Steppage gait, strikes foot down with whole foot rather than heel. Still some recurvatum at knee. LLE dtr's are 2-3+. Cranial nerve exam grossly intact. Heart regular. Chest is clear. Abdomen soft nontender. Affect much more dynamic. Recall excellent. Cognition essentially within normal limits now   Assessment & Plan:   ASSESSMENT:  1. Traumatic brain injury with spastic left hemiparesis and ongoing cognitive deficits although she continues to show behavioral and cognitive  progress 2. Frozen left shoulder    PLAN:  1. She's doing great!! (working and driving/exercising) 2. Continue zanaflex and vesicare.  3. Continue Ritalin to 10 mg b.i.d. #60. (with a second for next month) 4. All questions were encouraged and answered. 15 minutes were spent with the patient today.    I WILL SEE HER BACK IN 4 MONTHS. I WILL ALLOW HER TO PICK UP RITALIN PRESCRIPTION IN APPROXIMATELY 2 MONTHS. SHE WILL NEED TO CALL ONE WEEK PRIOR TO APPT TO PICK UP PRESCRIPTIONS.

## 2016-06-25 NOTE — Addendum Note (Signed)
Addended byReal Cons: HALES, MICHAEL W on: 06/25/2016 02:56 PM   Modules accepted: Orders

## 2016-06-25 NOTE — Patient Instructions (Addendum)
PLEASE CALL ME WITH ANY PROBLEMS OR QUESTIONS [phone])   YOU CAN PICK UP YOUR RITALIN PRESCRIPTIONS IN 2 MONTHS.

## 2016-07-04 LAB — TOXASSURE SELECT,+ANTIDEPR,UR: PDF: 0

## 2016-07-10 NOTE — Progress Notes (Signed)
Urine drug screen for this encounter is consistent for prescribed medications.   

## 2016-08-17 ENCOUNTER — Telehealth: Payer: Self-pay | Admitting: *Deleted
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
## 2016-08-19 DIAGNOSIS — L559 Sunburn, unspecified: Secondary | ICD-10-CM | POA: Diagnosis not present

## 2016-08-21 ENCOUNTER — Other Ambulatory Visit: Payer: Self-pay | Admitting: Physical Medicine & Rehabilitation

## 2016-08-21 DIAGNOSIS — G8114 Spastic hemiplegia affecting left nondominant side: Secondary | ICD-10-CM

## 2016-08-24 DIAGNOSIS — L559 Sunburn, unspecified: Secondary | ICD-10-CM | POA: Diagnosis not present

## 2016-09-04 ENCOUNTER — Telehealth: Payer: Self-pay | Admitting: *Deleted

## 2016-09-04 DIAGNOSIS — S069X5A Unspecified intracranial injury with loss of consciousness greater than 24 hours with return to pre-existing conscious level, initial encounter: Secondary | ICD-10-CM

## 2016-09-04 MED ORDER — METHYLPHENIDATE HCL 10 MG PO TABS: 10.0000 mg | ORAL_TABLET | Freq: Two times a day (BID) | ORAL | 0 refills | Status: DC

## 2016-09-04 NOTE — Telephone Encounter (Signed)
[name] called and says she can not find her 2nd prescription for Ritalin, it is not in her folder.  She is working 11:30-4:30.  Please call. It looks like she is due to pick up RX since she was given one  @ August appt and a second RX for September.  Her appt is not until 10/17/16. 2 new Rx will be printed for Riley KillSwartz to sign. Call OsceolaBridget when ready.

## 2016-09-05 NOTE — Telephone Encounter (Signed)
Briget picked up RX this am.

## 2016-09-21 DIAGNOSIS — M21619 Bunion of unspecified foot: Secondary | ICD-10-CM | POA: Diagnosis not present

## 2016-09-29 ENCOUNTER — Other Ambulatory Visit: Payer: Self-pay | Admitting: Physical Medicine & Rehabilitation

## 2016-09-29 DIAGNOSIS — S069X5A Unspecified intracranial injury with loss of consciousness greater than 24 hours with return to pre-existing conscious level, initial encounter: Secondary | ICD-10-CM

## 2016-09-29 DIAGNOSIS — G8114 Spastic hemiplegia affecting left nondominant side: Secondary | ICD-10-CM

## 2016-10-11 ENCOUNTER — Telehealth: Payer: Self-pay

## 2016-10-11 NOTE — Telephone Encounter (Signed)
Patient called stating needs a note that says she is able to mange her own finances since her parents are moving out.

## 2016-10-12 NOTE — Telephone Encounter (Signed)
We can write that, but unless there is something legal currently in writing which says otherwise, I'm not sure why we need to

## 2016-10-17 ENCOUNTER — Encounter: Payer: Medicare Other | Admitting: Physical Medicine & Rehabilitation

## 2016-10-17 ENCOUNTER — Telehealth: Payer: Self-pay | Admitting: Physical Medicine & Rehabilitation

## 2016-10-17 NOTE — Telephone Encounter (Signed)
Can discuss at 10/17/16 appt

## 2016-10-17 NOTE — Telephone Encounter (Signed)
I will look at forms. Not sure what to tell her about bunion without seeing it.  Needs to wear loose fitting shoes to start

## 2016-10-17 NOTE — Telephone Encounter (Signed)
Patient called this am stating she "would like her disability forms filled out and completed, so she could have her funds deposited directly to her account and also, she mentioned she had a bunion on her right foot and callus on her left foot" Patient would like to know what could she do. Please advise.

## 2016-10-17 NOTE — Telephone Encounter (Signed)
Patient canceled her visit today due to car trouble.  She would still like to have the letter written from previous discussions.  Please call patient.

## 2016-10-19 NOTE — Telephone Encounter (Signed)
Patient has been notified

## 2016-10-24 ENCOUNTER — Ambulatory Visit: Payer: Medicare Other | Admitting: Physical Medicine & Rehabilitation

## 2016-10-25 ENCOUNTER — Other Ambulatory Visit: Payer: Self-pay | Admitting: Physical Medicine & Rehabilitation

## 2016-10-25 DIAGNOSIS — G8114 Spastic hemiplegia affecting left nondominant side: Secondary | ICD-10-CM

## 2016-10-25 DIAGNOSIS — S069X5A Unspecified intracranial injury with loss of consciousness greater than 24 hours with return to pre-existing conscious level, initial encounter: Secondary | ICD-10-CM

## 2016-11-06 ENCOUNTER — Telehealth: Payer: Self-pay | Admitting: *Deleted

## 2016-11-06 DIAGNOSIS — S069X5A Unspecified intracranial injury with loss of consciousness greater than 24 hours with return to pre-existing conscious level, initial encounter: Secondary | ICD-10-CM

## 2016-11-06 MED ORDER — METHYLPHENIDATE HCL 10 MG PO TABS: 10.0000 mg | ORAL_TABLET | Freq: Two times a day (BID) | ORAL | 0 refills | Status: DC

## 2016-11-06 NOTE — Telephone Encounter (Signed)
[name] called x 3 asking for a refill on her ritalin.  Rx printed for [name] to sign.  She has appt 11/20/16 and must keep it. [name] notified it can be picked up around noon.

## 2016-11-18 ENCOUNTER — Other Ambulatory Visit: Payer: Self-pay | Admitting: Physical Medicine & Rehabilitation

## 2016-11-18 DIAGNOSIS — G8114 Spastic hemiplegia affecting left nondominant side: Secondary | ICD-10-CM

## 2016-11-18 DIAGNOSIS — S069X5A Unspecified intracranial injury with loss of consciousness greater than 24 hours with return to pre-existing conscious level, initial encounter: Secondary | ICD-10-CM

## 2016-11-20 ENCOUNTER — Encounter: Payer: Medicare Other | Attending: Physical Medicine & Rehabilitation | Admitting: Physical Medicine & Rehabilitation

## 2016-11-20 ENCOUNTER — Encounter: Payer: Self-pay | Admitting: Physical Medicine & Rehabilitation

## 2016-11-20 VITALS — BP 127/79 | HR 97

## 2016-11-20 DIAGNOSIS — M7502 Adhesive capsulitis of left shoulder: Secondary | ICD-10-CM | POA: Diagnosis not present

## 2016-11-20 DIAGNOSIS — Z87891 Personal history of nicotine dependence: Secondary | ICD-10-CM | POA: Diagnosis not present

## 2016-11-20 DIAGNOSIS — G8114 Spastic hemiplegia affecting left nondominant side: Secondary | ICD-10-CM

## 2016-11-20 DIAGNOSIS — Z5181 Encounter for therapeutic drug level monitoring: Secondary | ICD-10-CM | POA: Diagnosis not present

## 2016-11-20 DIAGNOSIS — I639 Cerebral infarction, unspecified: Secondary | ICD-10-CM | POA: Diagnosis not present

## 2016-11-20 DIAGNOSIS — L84 Corns and callosities: Secondary | ICD-10-CM | POA: Diagnosis not present

## 2016-11-20 DIAGNOSIS — S069X5A Unspecified intracranial injury with loss of consciousness greater than 24 hours with return to pre-existing conscious level, initial encounter: Secondary | ICD-10-CM

## 2016-11-20 DIAGNOSIS — S069X0A Unspecified intracranial injury without loss of consciousness, initial encounter: Secondary | ICD-10-CM | POA: Insufficient documentation

## 2016-11-20 DIAGNOSIS — Z79899 Other long term (current) drug therapy: Secondary | ICD-10-CM | POA: Diagnosis not present

## 2016-11-20 DIAGNOSIS — IMO0002 Reserved for concepts with insufficient information to code with codable children: Secondary | ICD-10-CM

## 2016-11-20 DIAGNOSIS — N319 Neuromuscular dysfunction of bladder, unspecified: Secondary | ICD-10-CM | POA: Diagnosis not present

## 2016-11-20 DIAGNOSIS — X58XXXA Exposure to other specified factors, initial encounter: Secondary | ICD-10-CM | POA: Insufficient documentation

## 2016-11-20 MED ORDER — METHYLPHENIDATE HCL 10 MG PO TABS: 10.0000 mg | ORAL_TABLET | Freq: Two times a day (BID) | ORAL | 0 refills | Status: DC

## 2016-11-20 MED ORDER — SOLIFENACIN SUCCINATE 5 MG PO TABS: ORAL_TABLET | ORAL | 5 refills | Status: DC

## 2016-11-20 NOTE — Patient Instructions (Signed)
1. WORK ON DAILY HEEL CORD STRETCHING LIKE I SHOWED YOU.  2. FOLLOW THAT UP WITH WALKING WHICH STRESSES STRIKING YOUR HEEL WHEN YOUR FOOT HITS THE GROUND (CURRENTLY THE FRONT OF YOUR FOOT IS STRIKING FIRST.  3. CONSIDER SOAKING THE FEET IN EPSOM SALT TO HELP LOOSEN THE CALLUSES 4. CONTINUE YOUR SOCKS/SHOEWEAR LIKE YOU'VE DONE ALREADY.   PLEASE FEEL FREE TO CALL OUR OFFICE WITH ANY PROBLEMS OR QUESTIONS [phone])

## 2016-11-20 NOTE — Addendum Note (Signed)
Addended by: Barbee ShropshireBRIGHT, Javarious Elsayed B on: 11/20/2016 01:18 PM   Modules accepted: Orders

## 2016-11-20 NOTE — Progress Notes (Signed)
Subjective:    Patient ID: [name], female    DOB: Jul 13, 1981, 36 y.o.   MRN: [id number]  HPI   [name] is here in follow up of her TBI. She has developed calluses/bunions on the middle of her right foot and along the lateral aspect of her left. She feels that it is happening because of her altered gait pattern. She has responded by buying bigger, better shoes. She is also wearing two pairs of socks and bandaids as well as arch supports.    She continues to drive and work. Things are going well at home. She has a significant other.   Pain Inventory Average Pain 2 Pain Right Now 1 My pain is .  In the last 24 hours, has pain interfered with the following? General activity 0 Relation with others 0 Enjoyment of life 0 What TIME of day is your pain at its worst? daytime Sleep (in general) Good  Pain is worse with: inactivity Pain improves with: rest, heat/ice, therapy/exercise, pacing activities and medication Relief from Meds: 9  Mobility walk without assistance ability to climb steps?  yes do you drive?  yes  Function employed # of hrs/week 32  Neuro/Psych anxiety  Prior Studies Any changes since last visit?  no  Physicians involved in your care Any changes since last visit?  no   No family history on file. Social History   Social History  . Marital status: Single    Spouse name: N/A  . Number of children: N/A  . Years of education: N/A   Social History Main Topics  . Smoking status: Former Smoker    Quit date: 09/25/2011  . Smokeless tobacco: Never Used  . Alcohol use Not on file  . Drug use: Unknown  . Sexual activity: Not on file   Other Topics Concern  . Not on file   Social History Narrative  . No narrative on file   Past Surgical History:  Procedure Laterality Date  . shunt placed  in head  2011   Past Medical History:  Diagnosis Date  . Intracranial injury of other and unspecified nature, without mention of open intracranial  wound, unspecified state of consciousness   . Personality change due to conditions classified elsewhere   . Spastic hemiplegia affecting nondominant side (HCC)    There were no vitals taken for this visit.  Opioid Risk Score:   Fall Risk Score:  `1  Depression screen PHQ 2/9  Depression screen [facility] 2/9 07/25/2015 02/03/2015  Decreased Interest 0 0  Down, Depressed, Hopeless 0 0  PHQ - 2 Score 0 0  Altered sleeping - 0  Tired, decreased energy - 0  Change in appetite - 0  Feeling bad or failure about yourself  - 0  Trouble concentrating - 1  Moving slowly or fidgety/restless - 0  Suicidal thoughts - 0  PHQ-9 Score - 1   Review of Systems  Constitutional: Negative.   HENT: Negative.   Eyes: Negative.   Respiratory: Negative.   Cardiovascular: Negative.   Gastrointestinal: Negative.   Endocrine: Negative.   Genitourinary: Negative.   Musculoskeletal: Negative.   Skin: Negative.   Allergic/Immunologic: Negative.   Neurological: Negative.   Hematological: Negative.   Psychiatric/Behavioral: The patient is nervous/anxious.        Objective:   Physical Exam  Alert and appropriate. She is oriented x2. Spasticity in the left leg 1-2/4. I think her Tib anterior is tightest today, perhaps tibialis posterior, gastroc. LUE motor  is 4-/5. Shoulder is looser. Left shoulder ROM has improved quite a bit. LLE is 3+ to 4/5 prox to 2+ to 3 distally. Tone at the ankle is 1+ to 2+ with plantar flexion and inversion. Steppage gait still present but better. However she still strikes foot with forefoot instead of heel. Lands more on latera forefoot on left.. Still some recurvatum at knee. LLE dtr's are 2-3+. Cranial nerve exam grossly intact. Heart regular. Chest is clear. Abdomen soft nontender. Affect much more dynamic. Recall excellent. Cognition essentially within normal limits now  Skin: Quarter size callus left fifth MT head. Slightly smaller callus right 3rd MT head.    Assessment &  Plan:  ASSESSMENT:  1. Traumatic brain injury with spastic left hemiparesis and ongoing cognitive deficits although she continues to show behavioral and cognitive progress 2. Frozen left shoulder  3. Metatarsal calluses, right 3rd MT head, left 5th MT head   PLAN:  1. Reviewed instructions for callus management. A lot of it stems back to her gait pattern. Reviewed stretches today. She's already doing well with shoewear, socks, etc.  2. Continue zanaflex and vesicare. Refilled vesicare today 3. Continue Ritalin to 10 mg b.i.d. #60. (with a second for next month) 4. All questions were encouraged and answered. 15 minutes were spent with the patient today.    I WILL SEE HER BACK IN 4 MONTHS. I WILL ALLOW HER TO PICK UP RITALIN PRESCRIPTION IN APPROXIMATELY 2 MONTHS. SHE WILL NEED TO CALL ONE WEEK PRIOR TO APPT TO PICK UP PRESCRIPTIONS.

## 2016-11-26 LAB — TOXASSURE SELECT,+ANTIDEPR,UR

## 2016-11-29 NOTE — Progress Notes (Signed)
Urine drug screen for this encounter is consistent for prescribed medication 

## 2017-01-18 ENCOUNTER — Other Ambulatory Visit: Payer: Self-pay | Admitting: Physical Medicine & Rehabilitation

## 2017-01-18 DIAGNOSIS — G8114 Spastic hemiplegia affecting left nondominant side: Secondary | ICD-10-CM

## 2017-01-19 ENCOUNTER — Other Ambulatory Visit: Payer: Self-pay | Admitting: Physical Medicine & Rehabilitation

## 2017-01-31 ENCOUNTER — Telehealth: Payer: Self-pay | Admitting: *Deleted

## 2017-01-31 DIAGNOSIS — S069X5A Unspecified intracranial injury with loss of consciousness greater than 24 hours with return to pre-existing conscious level, initial encounter: Secondary | ICD-10-CM

## 2017-01-31 MED ORDER — METHYLPHENIDATE HCL 10 MG PO TABS: 10.0000 mg | ORAL_TABLET | Freq: Two times a day (BID) | ORAL | 0 refills | Status: DC

## 2017-01-31 NOTE — Telephone Encounter (Signed)
[name] called for her Ritalin refills.  She has 8 pills left.  Last fill was 12/31/16 by [facility].  Her next appt is 03/20/17.  [facility]. [name] notified they will be ready for pickup.

## 2017-02-04 ENCOUNTER — Other Ambulatory Visit: Payer: Self-pay | Admitting: Physical Medicine & Rehabilitation

## 2017-02-04 NOTE — Telephone Encounter (Signed)
Patient called 2 x's on 3/29 and once on 3/30 and states please leave her refill up front for ridalin - she will pick up on Monday she is off that day [phone] call if any questions

## 2017-02-04 NOTE — Telephone Encounter (Signed)
This was done with the first phone call--see previous message and voicemail left for [place].  Rocky Link has spoken with her as well.

## 2017-02-13 ENCOUNTER — Other Ambulatory Visit: Payer: Self-pay | Admitting: Physical Medicine & Rehabilitation

## 2017-03-20 ENCOUNTER — Encounter: Payer: Medicare Other | Attending: Physical Medicine & Rehabilitation | Admitting: Physical Medicine & Rehabilitation

## 2017-03-20 ENCOUNTER — Encounter: Payer: Self-pay | Admitting: Physical Medicine & Rehabilitation

## 2017-03-20 DIAGNOSIS — G8114 Spastic hemiplegia affecting left nondominant side: Secondary | ICD-10-CM | POA: Diagnosis not present

## 2017-03-20 DIAGNOSIS — M7502 Adhesive capsulitis of left shoulder: Secondary | ICD-10-CM | POA: Diagnosis not present

## 2017-03-20 DIAGNOSIS — X58XXXA Exposure to other specified factors, initial encounter: Secondary | ICD-10-CM | POA: Insufficient documentation

## 2017-03-20 DIAGNOSIS — S069X5A Unspecified intracranial injury with loss of consciousness greater than 24 hours with return to pre-existing conscious level, initial encounter: Secondary | ICD-10-CM | POA: Insufficient documentation

## 2017-03-20 DIAGNOSIS — I639 Cerebral infarction, unspecified: Secondary | ICD-10-CM | POA: Diagnosis not present

## 2017-03-20 DIAGNOSIS — L84 Corns and callosities: Secondary | ICD-10-CM | POA: Diagnosis not present

## 2017-03-20 DIAGNOSIS — Z87891 Personal history of nicotine dependence: Secondary | ICD-10-CM | POA: Insufficient documentation

## 2017-03-20 MED ORDER — METHYLPHENIDATE HCL 10 MG PO TABS: 10.0000 mg | ORAL_TABLET | Freq: Two times a day (BID) | ORAL | 0 refills | Status: DC

## 2017-03-20 NOTE — Progress Notes (Signed)
Subjective:    Patient ID: [name], female    DOB: 28-Dec-1980, 36 y.o.   MRN: [id number]  HPI   Jeneen is here in follow up of her TBI and associated deficits. She is doing well. 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 She goes to the gym regularly for exercise. She is independent with her all of her personal care   She uses ritalin 10mg  bid for attention and cognition. Her mood has been excellent   She utilizes a topical treatment for her calluses. Her pain is minimal at this point   Pain Inventory Average Pain 2 Pain Right Now 0 My pain is dull  In the last 24 hours, has pain interfered with the following? General activity 0 Relation with others 0 Enjoyment of life 0 What TIME of day is your pain at its worst? morning Sleep (in general) Good  Pain is worse with: inactivity Pain improves with: rest, heat/ice, therapy/exercise, pacing activities and medication Relief from Meds: .  Mobility walk without assistance ability to climb steps?  yes do you drive?  yes  Function employed # of hrs/week 35  Neuro/Psych No problems in this area  Prior Studies Any changes since last visit?  no  Physicians involved in your care Any changes since last visit?  no   No family history on file. Social History   Social History  . Marital status: Single    Spouse name: N/A  . Number of children: N/A  . Years of education: N/A   Social History Main Topics  . Smoking status: Former Smoker    Quit date: 09/25/2011  . Smokeless tobacco: Never Used  . Alcohol use Not on file  . Drug use: Unknown  . Sexual activity: Not on file   Other Topics Concern  . Not on file   Social History Narrative  . No narrative on file   Past Surgical History:  Procedure Laterality Date  . shunt placed  in head  2011   Past Medical History:  Diagnosis Date  . Intracranial injury of other and unspecified nature, without mention of open intracranial  wound, unspecified state of consciousness   . Personality change due to conditions classified elsewhere   . Spastic hemiplegia affecting nondominant side (HCC)    There were no vitals taken for this visit.  Opioid Risk Score:   Fall Risk Score:  `1  Depression screen PHQ 2/9  Depression screen [facility] 2/9 07/25/2015 02/03/2015  Decreased Interest 0 0  Down, Depressed, Hopeless 0 0  PHQ - 2 Score 0 0  Altered sleeping - 0  Tired, decreased energy - 0  Change in appetite - 0  Feeling bad or failure about yourself  - 0  Trouble concentrating - 1  Moving slowly or fidgety/restless - 0  Suicidal thoughts - 0  PHQ-9 Score - 1    Review of Systems  Constitutional: Negative.   HENT: Negative.   Eyes: Negative.   Respiratory: Negative.   Cardiovascular: Negative.   Gastrointestinal: Negative.   Endocrine: Negative.   Genitourinary: Negative.   Musculoskeletal: Negative.   Skin: Negative.   Allergic/Immunologic: Negative.   Neurological: Negative.   Hematological: Negative.   Psychiatric/Behavioral: Negative.   All other systems reviewed and are negative.      Objective:   Physical Exam  Alert and appropriate. She is oriented x2. Spasticity in the left leg 1-2/4. I think her Tib anterior is tightest  today, perhaps tibialis posterior, gastroc. LUE motor is 4-/5. Shoulder is looser. Left shoulder ROM has improved quite a bit. LLE is 3+ to 4/5 prox to 2+ to 3 distally. Tone at the ankle is 1+ to 2+ with plantar flexion and inversion. Steppage gait still present but better. Still with equinovarus deformity LLE, strikes with forefoot landing on lateral forefoot on left.. Still some recurvatum at knee. LLE dtr's are 2-3+. Cranial nerve exam grossly intact. Heart regular. Chest is clear. Abdomen soft nontender. Affect much more dynamic. Recall excellent. Cognition essentially within normal limits now  Skin: continue size callus left fifth MT head and right 3rd MT head.    Assessment &  Plan:  ASSESSMENT:  1. Traumatic brain injury with spastic left hemiparesis and ongoing cognitive deficits although she continues to show behavioral and cognitive progress 2. Frozen left shoulder  3. Metatarsal calluses, right 3rd MT head, left 5th MT head   PLAN:  1. Continue stretching and HEP. Reviewed heel cord stretches today 2. Continue zanaflex and vesicare. 3. Continue Ritalin to 10 mg b.i.d. #60. (with a second for next month). She's doing well with this 4. All questions were encouraged and answered. 15 minutes were spent with the patient today.    Follow up in 4 months. Can pick up ritalin rx in 2 months. Greater than 50% of time during this encounter was spent counseling patient/family in regard to HEP/stretching routine.

## 2017-03-20 NOTE — Patient Instructions (Signed)
PLEASE FEEL FREE TO CALL OUR OFFICE WITH ANY PROBLEMS OR QUESTIONS ([phone])      

## 2017-04-02 DIAGNOSIS — Z30431 Encounter for routine checking of intrauterine contraceptive device: Secondary | ICD-10-CM | POA: Diagnosis not present

## 2017-04-02 DIAGNOSIS — Z01419 Encounter for gynecological examination (general) (routine) without abnormal findings: Secondary | ICD-10-CM | POA: Diagnosis not present

## 2017-04-02 DIAGNOSIS — Z6825 Body mass index (BMI) 25.0-25.9, adult: Secondary | ICD-10-CM | POA: Diagnosis not present

## 2017-04-02 DIAGNOSIS — Z130 Encounter for screening for diseases of the blood and blood-forming organs and certain disorders involving the immune mechanism: Secondary | ICD-10-CM | POA: Diagnosis not present

## 2017-04-02 DIAGNOSIS — Z1389 Encounter for screening for other disorder: Secondary | ICD-10-CM | POA: Diagnosis not present

## 2017-04-03 ENCOUNTER — Other Ambulatory Visit: Payer: Self-pay | Admitting: Physical Medicine & Rehabilitation

## 2017-05-09 ENCOUNTER — Other Ambulatory Visit: Payer: Self-pay | Admitting: Physical Medicine & Rehabilitation

## 2017-05-09 DIAGNOSIS — N319 Neuromuscular dysfunction of bladder, unspecified: Secondary | ICD-10-CM

## 2017-05-09 DIAGNOSIS — S069X5A Unspecified intracranial injury with loss of consciousness greater than 24 hours with return to pre-existing conscious level, initial encounter: Secondary | ICD-10-CM

## 2017-06-01 ENCOUNTER — Other Ambulatory Visit: Payer: Self-pay | Admitting: Physical Medicine & Rehabilitation

## 2017-06-01 DIAGNOSIS — S069X5A Unspecified intracranial injury with loss of consciousness greater than 24 hours with return to pre-existing conscious level, initial encounter: Secondary | ICD-10-CM

## 2017-06-01 DIAGNOSIS — N319 Neuromuscular dysfunction of bladder, unspecified: Secondary | ICD-10-CM

## 2017-06-03 ENCOUNTER — Telehealth: Payer: Self-pay | Admitting: *Deleted

## 2017-06-03 DIAGNOSIS — S069X5A Unspecified intracranial injury with loss of consciousness greater than 24 hours with return to pre-existing conscious level, initial encounter: Secondary | ICD-10-CM

## 2017-06-03 MED ORDER — METHYLPHENIDATE HCL 10 MG PO TABS: 10.0000 mg | ORAL_TABLET | Freq: Two times a day (BID) | ORAL | 0 refills | Status: DC

## 2017-06-03 NOTE — Telephone Encounter (Signed)
[name] called for her refills on her Ritalin. Per Dr Riley KillSwartz note 2 month Rx printed to be signed and picked up. [name] notified

## 2017-06-04 ENCOUNTER — Telehealth: Payer: Self-pay

## 2017-06-04 NOTE — Telephone Encounter (Signed)
Patient called to inquire about prescription refills for ritalin, noted in chart that scripts were printed yesterday, called patient back and left message that she can pick those up at her convience.

## 2017-07-22 ENCOUNTER — Encounter: Payer: Self-pay | Admitting: Physical Medicine & Rehabilitation

## 2017-07-22 ENCOUNTER — Encounter: Payer: Medicare Other | Attending: Physical Medicine & Rehabilitation | Admitting: Physical Medicine & Rehabilitation

## 2017-07-22 DIAGNOSIS — L84 Corns and callosities: Secondary | ICD-10-CM | POA: Diagnosis not present

## 2017-07-22 DIAGNOSIS — S069X0D Unspecified intracranial injury without loss of consciousness, subsequent encounter: Secondary | ICD-10-CM | POA: Insufficient documentation

## 2017-07-22 DIAGNOSIS — S069X5A Unspecified intracranial injury with loss of consciousness greater than 24 hours with return to pre-existing conscious level, initial encounter: Secondary | ICD-10-CM

## 2017-07-22 DIAGNOSIS — Z87891 Personal history of nicotine dependence: Secondary | ICD-10-CM | POA: Insufficient documentation

## 2017-07-22 DIAGNOSIS — F419 Anxiety disorder, unspecified: Secondary | ICD-10-CM | POA: Insufficient documentation

## 2017-07-22 DIAGNOSIS — X58XXXD Exposure to other specified factors, subsequent encounter: Secondary | ICD-10-CM | POA: Diagnosis not present

## 2017-07-22 DIAGNOSIS — M7502 Adhesive capsulitis of left shoulder: Secondary | ICD-10-CM | POA: Diagnosis not present

## 2017-07-22 DIAGNOSIS — G8114 Spastic hemiplegia affecting left nondominant side: Secondary | ICD-10-CM

## 2017-07-22 DIAGNOSIS — I639 Cerebral infarction, unspecified: Secondary | ICD-10-CM

## 2017-07-22 MED ORDER — METHYLPHENIDATE HCL 10 MG PO TABS: 10.0000 mg | ORAL_TABLET | Freq: Two times a day (BID) | ORAL | 0 refills | Status: DC

## 2017-07-22 NOTE — Progress Notes (Signed)
Subjective:    Patient ID: [name], female    DOB: 1981-07-03, 36 y.o.   MRN: [id number]  HPI  [name] is here in follow up of her TBI and associated deficits. She is working at [employer] and saving money for a new car. Her relationship with her boyfriend is going well.   The ritalin remains efffective for her attention/memory. She notices a big diffference when she doesn't take it especially at work.    Pain Inventory Average Pain 1 Pain Right Now 0 My pain is dull  In the last 24 hours, has pain interfered with the following? General activity 1 Relation with others 0 Enjoyment of life 0 What TIME of day is your pain at its worst? evening Sleep (in general) Good  Pain is worse with: inactivity Pain improves with: rest, therapy/exercise and medication Relief from Meds: no pain  Mobility walk without assistance how many minutes can you walk? 30 ability to climb steps?  yes do you drive?  yes transfers alone Do you have any goals in this area?  yes  Function employed # of hrs/week 20-30 Do you have any goals in this area?  yes  Neuro/Psych anxiety  Prior Studies Any changes since last visit?  no  Physicians involved in your care Any changes since last visit?  no   History reviewed. No pertinent family history. Social History   Social History  . Marital status: Single    Spouse name: N/A  . Number of children: N/A  . Years of education: N/A   Social History Main Topics  . Smoking status: Former Smoker    Quit date: 09/25/2011  . Smokeless tobacco: Never Used  . Alcohol use None  . Drug use: Unknown  . Sexual activity: Not Asked   Other Topics Concern  . None   Social History Narrative  . None   Past Surgical History:  Procedure Laterality Date  . shunt placed  in head  2011   Past Medical History:  Diagnosis Date  . Intracranial injury of other and unspecified nature, without mention of open intracranial wound, unspecified state of  consciousness   . Personality change due to conditions classified elsewhere   . Spastic hemiplegia affecting nondominant side (HCC)    BP 115/82 (BP Location: Left Arm, Patient Position: Sitting, Cuff Size: Normal)   Pulse 99   Resp 14   SpO2 96%   Opioid Risk Score:   Fall Risk Score:  `1  Depression screen PHQ 2/9  Depression screen [facility] 2/9 07/25/2015 02/03/2015  Decreased Interest 0 0  Down, Depressed, Hopeless 0 0  PHQ - 2 Score 0 0  Altered sleeping - 0  Tired, decreased energy - 0  Change in appetite - 0  Feeling bad or failure about yourself  - 0  Trouble concentrating - 1  Moving slowly or fidgety/restless - 0  Suicidal thoughts - 0  PHQ-9 Score - 1    Review of Systems  Constitutional: Negative.   HENT: Negative.   Eyes: Negative.   Respiratory: Negative.   Cardiovascular: Negative.   Gastrointestinal: Negative.   Endocrine: Negative.   Genitourinary: Negative.   Musculoskeletal: Positive for myalgias.  Skin: Negative.   Neurological: Negative.   Hematological: Negative.   Psychiatric/Behavioral: The patient is nervous/anxious.        Objective:   Physical Exam RRR Chest clear. Normal effort Alert and appropriate. She is oriented x2. Spasticity in the left leg 1-2/4.  LUE motor  is 4-/5. Shoulder is looser. Left shoulder ROM has improved quite a bit. LLE is 3+ to 4/5 prox to  3 distally--stable. Tone at the ankle is 1+ with plantar flexion and inversion. . Still with equinovarus deformity LLE--steppage gait. Clears leg reasonably well. LLE dtr's are 2-3+. Cranial nerve exam grossly intact. Heart regular. Chest is clear. Abdomen soft nontender. Affect much more dynamic. Recall excellent. Cognition essentially within normal limits now  Skin: improved callus left fifth MT head and right 3rd MT head.    Assessment & Plan:  ASSESSMENT:  1. Traumatic brain injury with spastic left hemiparesis and ongoing cognitive deficits although she continues to show  behavioral and cognitive progress 2. Frozen left shoulder  3. Metatarsal calluses, right 3rd MT head, left 5th MT head   PLAN:  1. Continue stretching and HEP. Reviewed heel cord stretches today 2. Continue zanaflex and vesicare. 3. Continue Ritalin to 10 mg b.i.d. #60.  This continues to be effective 4. All questions were encouraged and answered. 15 minutes were spent with the patient today.    Follow up in 2 months. Greater than 50% of time during this encounter was spent counseling patient/family in regard to HEP/stretching routine.

## 2017-07-22 NOTE — Patient Instructions (Signed)
PLEASE FEEL FREE TO CALL OUR OFFICE WITH ANY PROBLEMS OR QUESTIONS ([phone])      

## 2017-08-18 ENCOUNTER — Other Ambulatory Visit: Payer: Self-pay | Admitting: Physical Medicine & Rehabilitation

## 2017-08-18 DIAGNOSIS — S069X5A Unspecified intracranial injury with loss of consciousness greater than 24 hours with return to pre-existing conscious level, initial encounter: Secondary | ICD-10-CM

## 2017-08-18 DIAGNOSIS — N319 Neuromuscular dysfunction of bladder, unspecified: Secondary | ICD-10-CM

## 2017-08-19 ENCOUNTER — Telehealth: Payer: Self-pay | Admitting: *Deleted
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
## 2017-08-21 ENCOUNTER — Telehealth: Payer: Self-pay

## 2017-08-21 NOTE — Telephone Encounter (Signed)
She might be experiencing some reflux. Recommend staying up right after she eats and trying something for acid reflux which she can pick up over the counter. If it worsens, she should call back

## 2017-08-21 NOTE — Telephone Encounter (Signed)
I attempted to call patient, went to voicemail.  Voicemail box was full.  Unable to leave message. I attempted to call other listed numbers as well to no avail.

## 2017-08-21 NOTE — Telephone Encounter (Signed)
Patient called stating that she has a question concerning an issue and needs to speak to someone, she will be at work until 4pm and is off all day tomorrow.

## 2017-08-21 NOTE — Telephone Encounter (Signed)
Miss [name] called back.  I passed on advice given by Dr. Riley KillSwartz.  She will give us a call back if the situation does not improve

## 2017-08-26 ENCOUNTER — Telehealth: Payer: Self-pay | Admitting: *Deleted

## 2017-08-26 DIAGNOSIS — S069X5A Unspecified intracranial injury with loss of consciousness greater than 24 hours with return to pre-existing conscious level, initial encounter: Secondary | ICD-10-CM

## 2017-08-26 MED ORDER — METHYLPHENIDATE HCL 10 MG PO TABS: 10.0000 mg | ORAL_TABLET | Freq: Two times a day (BID) | ORAL | 0 refills | Status: DC

## 2017-08-26 NOTE — Telephone Encounter (Signed)
Patient called requesting next months script for Ritalin.. Reviewed last clinic note. Script printed and signed.  Patient notified that it is ready for pickup

## 2017-08-26 NOTE — Telephone Encounter (Signed)
Contacted patient to get more detail.  She was calling to inform that OTC zantac was working good to control her acid reflux.

## 2017-09-18 ENCOUNTER — Other Ambulatory Visit: Payer: Self-pay

## 2017-09-18 ENCOUNTER — Encounter: Payer: Medicare Other | Attending: Physical Medicine & Rehabilitation | Admitting: Registered Nurse

## 2017-09-18 ENCOUNTER — Encounter: Payer: Self-pay | Admitting: Registered Nurse

## 2017-09-18 ENCOUNTER — Telehealth: Payer: Self-pay | Admitting: Registered Nurse

## 2017-09-18 VITALS — BP 109/76 | HR 95

## 2017-09-18 DIAGNOSIS — Z79899 Other long term (current) drug therapy: Secondary | ICD-10-CM

## 2017-09-18 DIAGNOSIS — Z87891 Personal history of nicotine dependence: Secondary | ICD-10-CM | POA: Insufficient documentation

## 2017-09-18 DIAGNOSIS — S069X0D Unspecified intracranial injury without loss of consciousness, subsequent encounter: Secondary | ICD-10-CM | POA: Insufficient documentation

## 2017-09-18 DIAGNOSIS — Z5181 Encounter for therapeutic drug level monitoring: Secondary | ICD-10-CM

## 2017-09-18 DIAGNOSIS — M7502 Adhesive capsulitis of left shoulder: Secondary | ICD-10-CM | POA: Insufficient documentation

## 2017-09-18 DIAGNOSIS — S069X5A Unspecified intracranial injury with loss of consciousness greater than 24 hours with return to pre-existing conscious level, initial encounter: Secondary | ICD-10-CM | POA: Diagnosis not present

## 2017-09-18 DIAGNOSIS — G8114 Spastic hemiplegia affecting left nondominant side: Secondary | ICD-10-CM | POA: Diagnosis not present

## 2017-09-18 DIAGNOSIS — F419 Anxiety disorder, unspecified: Secondary | ICD-10-CM | POA: Insufficient documentation

## 2017-09-18 DIAGNOSIS — L84 Corns and callosities: Secondary | ICD-10-CM | POA: Insufficient documentation

## 2017-09-18 DIAGNOSIS — I639 Cerebral infarction, unspecified: Secondary | ICD-10-CM | POA: Diagnosis not present

## 2017-09-18 MED ORDER — METHYLPHENIDATE HCL 10 MG PO TABS: 10.0000 mg | ORAL_TABLET | Freq: Two times a day (BID) | ORAL | 0 refills | Status: DC

## 2017-09-18 NOTE — Telephone Encounter (Signed)
Placed a call to [name], she inquired about PCP offices in [place], left message regarding [place] [phone]. Also reiterated and instructed if she has concerned regarding left eye foreign body, to go to Urgent care.

## 2017-09-18 NOTE — Progress Notes (Deleted)
   Subjective:    Patient ID: [name], female    DOB: [birth year], 36 y.o.   MRN: [id number]  HPI  Pain Inventory Average Pain 1 Pain Right Now 0 My pain is .  In the last 24 hours, has pain interfered with the following? General activity 1 Relation with others 0 Enjoyment of life 1 What TIME of day is your pain at its worst? evening Sleep (in general) Good  Pain is worse with: . Pain improves with: rest, heat/ice, therapy/exercise, pacing activities and medication Relief from Meds: .  Mobility walk without assistance ability to climb steps?  yes do you drive?  yes  Function employed # of hrs/week 25-30  Neuro/Psych anxiety  Prior Studies Any changes since last visit?  no  Physicians involved in your care Any changes since last visit?  no   No family history on file. Social History   Socioeconomic History  . Marital status: Single    Spouse name: Not on file  . Number of children: Not on file  . Years of education: Not on file  . Highest education level: Not on file  Social Needs  . Financial resource strain: Not on file  . Food insecurity - worry: Not on file  . Food insecurity - inability: Not on file  . Transportation needs - medical: Not on file  . Transportation needs - non-medical: Not on file  Occupational History  . Not on file  Tobacco Use  . Smoking status: Former Smoker    Last attempt to quit: 09/25/2011    Years since quitting: 5.9  . Smokeless tobacco: Never Used  Substance and Sexual Activity  . Alcohol use: Not on file  . Drug use: Not on file  . Sexual activity: Not on file  Other Topics Concern  . Not on file  Social History Narrative  . Not on file   Past Surgical History:  Procedure Laterality Date  . shunt placed  in head  2011   Past Medical History:  Diagnosis Date  . Intracranial injury of other and unspecified nature, without mention of open intracranial wound, unspecified state of consciousness   .  Personality change due to conditions classified elsewhere   . Spastic hemiplegia affecting nondominant side (HCC)    There were no vitals taken for this visit.  Opioid Risk Score:   Fall Risk Score:  `1  Depression screen PHQ 2/9  [facility] 07/25/2015 02/03/2015  Decreased Interest 0 0  Down, Depressed, Hopeless 0 0  PHQ - 2 Score 0 0  Altered sleeping - 0  Tired, decreased energy - 0  Change in appetite - 0  Feeling bad or failure about yourself  - 0  Trouble concentrating - 1  Moving slowly or fidgety/restless - 0  Suicidal thoughts - 0  PHQ-9 Score - 1     Review of Systems  Constitutional: Negative.   HENT: Negative.   Eyes: Negative.   Respiratory: Negative.   Cardiovascular: Negative.   Gastrointestinal: Negative.   Endocrine: Negative.   Genitourinary: Negative.   Musculoskeletal: Negative.   Skin: Negative.   Allergic/Immunologic: Negative.   Neurological: Negative.   Hematological: Negative.   Psychiatric/Behavioral: Negative.   All other systems reviewed and are negative.      Objective:   Physical Exam        Assessment & Plan:
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
## 2017-09-18 NOTE — Telephone Encounter (Signed)
On 09/18/2017 the NCCSR was reviewed no conflict was seen on the [facility] Controlled Substance Reporting System with multiple prescribers.  If there were any discrepancies this would have been reported to her physician.

## 2017-10-31 ENCOUNTER — Other Ambulatory Visit: Payer: Self-pay | Admitting: Physical Medicine & Rehabilitation

## 2017-10-31 DIAGNOSIS — S069X5A Unspecified intracranial injury with loss of consciousness greater than 24 hours with return to pre-existing conscious level, initial encounter: Secondary | ICD-10-CM

## 2017-10-31 DIAGNOSIS — N319 Neuromuscular dysfunction of bladder, unspecified: Secondary | ICD-10-CM

## 2017-11-18 ENCOUNTER — Encounter: Payer: Medicare Other | Admitting: Physical Medicine & Rehabilitation

## 2017-11-25 ENCOUNTER — Other Ambulatory Visit: Payer: Self-pay

## 2017-11-25 ENCOUNTER — Encounter: Payer: Medicare Other | Attending: Physical Medicine & Rehabilitation | Admitting: Physical Medicine & Rehabilitation

## 2017-11-25 ENCOUNTER — Encounter: Payer: Self-pay | Admitting: Physical Medicine & Rehabilitation

## 2017-11-25 VITALS — BP 116/78 | HR 95

## 2017-11-25 DIAGNOSIS — Z87891 Personal history of nicotine dependence: Secondary | ICD-10-CM | POA: Diagnosis not present

## 2017-11-25 DIAGNOSIS — Z79899 Other long term (current) drug therapy: Secondary | ICD-10-CM | POA: Diagnosis not present

## 2017-11-25 DIAGNOSIS — F419 Anxiety disorder, unspecified: Secondary | ICD-10-CM | POA: Insufficient documentation

## 2017-11-25 DIAGNOSIS — Z5181 Encounter for therapeutic drug level monitoring: Secondary | ICD-10-CM | POA: Insufficient documentation

## 2017-11-25 DIAGNOSIS — L84 Corns and callosities: Secondary | ICD-10-CM | POA: Diagnosis not present

## 2017-11-25 DIAGNOSIS — S069X0D Unspecified intracranial injury without loss of consciousness, subsequent encounter: Secondary | ICD-10-CM | POA: Insufficient documentation

## 2017-11-25 DIAGNOSIS — S069X5A Unspecified intracranial injury with loss of consciousness greater than 24 hours with return to pre-existing conscious level, initial encounter: Secondary | ICD-10-CM

## 2017-11-25 DIAGNOSIS — M7502 Adhesive capsulitis of left shoulder: Secondary | ICD-10-CM | POA: Diagnosis not present

## 2017-11-25 DIAGNOSIS — G8114 Spastic hemiplegia affecting left nondominant side: Secondary | ICD-10-CM | POA: Insufficient documentation

## 2017-11-25 DIAGNOSIS — G894 Chronic pain syndrome: Secondary | ICD-10-CM | POA: Diagnosis not present

## 2017-11-25 DIAGNOSIS — M75102 Unspecified rotator cuff tear or rupture of left shoulder, not specified as traumatic: Secondary | ICD-10-CM | POA: Diagnosis not present

## 2017-11-25 MED ORDER — METHYLPHENIDATE HCL 10 MG PO TABS: 10.0000 mg | ORAL_TABLET | Freq: Two times a day (BID) | ORAL | 0 refills | Status: DC

## 2017-11-25 NOTE — Addendum Note (Signed)
Addended by: Lynetta MareBOWMAN, Aryahi Denzler F on: 11/25/2017 12:03 PM   Modules accepted: Orders

## 2017-11-25 NOTE — Patient Instructions (Signed)
PLEASE FEEL FREE TO CALL OUR OFFICE WITH ANY PROBLEMS OR QUESTIONS ([phone])      

## 2017-11-25 NOTE — Progress Notes (Signed)
Subjective:    Patient ID: [name], female    DOB: 05-30-1981, 37 y.o.   MRN: [id number]  HPI   [name] is here in follow-up of her traumatic brain injury and associated deficits.  She continues to do very well at home.  She is independent and driving.  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  She seems to like what she does.  She would like to work the for an hour or so that she was off each weekend but otherwise seems quite happy.  She remains on her Ritalin for attention which allows her to focus and perform the duties of her job.  She is sleeping well.  She uses Zanaflex still for spasms when needed.  The Vesicare helps with her urinary urgency.  She works out routinely at [employer] to help stretch her shoulder as well as her left heel.  She is doing well from a social standpoint as well   Pain Inventory Average Pain 2 Pain Right Now 0 My pain is dull  In the last 24 hours, has pain interfered with the following? General activity 1 Relation with others 0 Enjoyment of life 0 What TIME of day is your pain at its worst? evening Sleep (in general) Good  Pain is worse with: . Pain improves with: rest, heat/ice, therapy/exercise, pacing activities and medication Relief from Meds: 0  Mobility walk without assistance how many minutes can you walk? 30-45 ability to climb steps?  yes do you drive?  yes transfers alone Do you have any goals in this area?  yes  Function employed # of hrs/week 20-30 disabled: date disabled . Do you have any goals in this area?  yes  Neuro/Psych anxiety  Prior Studies Any changes since last visit?  no  Physicians involved in your care Any changes since last visit?  no   No family history on file. Social History   Socioeconomic History  . Marital status: Single    Spouse name: None  . Number of children: None  . Years of education: None  . Highest education level: None  Social Needs  . Financial resource  strain: None  . Food insecurity - worry: None  . Food insecurity - inability: None  . Transportation needs - medical: None  . Transportation needs - non-medical: None  Occupational History  . None  Tobacco Use  . Smoking status: Former Smoker    Last attempt to quit: 09/25/2011    Years since quitting: 6.1  . Smokeless tobacco: Never Used  Substance and Sexual Activity  . Alcohol use: None  . Drug use: None  . Sexual activity: None  Other Topics Concern  . None  Social History Narrative  . None   Past Surgical History:  Procedure Laterality Date  . shunt placed  in head  2011   Past Medical History:  Diagnosis Date  . Intracranial injury of other and unspecified nature, without mention of open intracranial wound, unspecified state of consciousness   . Personality change due to conditions classified elsewhere   . Spastic hemiplegia affecting nondominant side (HCC)    There were no vitals taken for this visit.  Opioid Risk Score:   Fall Risk Score:  `1  Depression screen PHQ 2/9  [facility] 11/25/2017 07/25/2015 02/03/2015  Decreased Interest 0 0 0  Down, Depressed, Hopeless 0 0 0  PHQ - 2 Score 0 0 0  Altered sleeping - - 0  Tired, decreased energy - - 0  Change in appetite - - 0  Feeling bad or failure about yourself  - - 0  Trouble concentrating - - 1  Moving slowly or fidgety/restless - - 0  Suicidal thoughts - - 0  PHQ-9 Score - - 1     Review of Systems  Constitutional: Negative.   HENT: Negative.   Eyes: Negative.   Respiratory: Negative.   Cardiovascular: Negative.   Gastrointestinal: Negative.   Endocrine: Negative.   Genitourinary: Negative.   Musculoskeletal: Negative.   Skin: Negative.   Allergic/Immunologic: Negative.   Neurological: Negative.   Hematological: Negative.   Psychiatric/Behavioral: Negative.        Objective:   Physical Exam  RRR Chest clear. Normal effort Alert and appropriate. She is oriented x2.  Spasticity in the left leg 1-2/4.  LUE motor is 4-/5. Shoulder is loose. Left shoulder ROM has improved quite a bit. LLE is 3+ to 4/5 prox to  3 distally--stable. Tone at the ankle is 1+ with plantar flexion and inversion. . Still with equinovarus deformity LLE and walks with heel off ground, some circumduction.  LLE dtr's are 2-3+. Cranial nerve exam grossly intact. Cognitively displays good insight and awareness.   Skin: improved callus left fifth MT head and right 3rd MT head.    Assessment & Plan:   ASSESSMENT:  1. Traumatic brain injury with spastic left hemiparesis and ongoing cognitive deficits although she continues to show behavioral and cognitive progress 2. Frozen left shoulder  3. Metatarsal calluses, right 3rd MT head, left 5th MT head   PLAN:  1. Continue stretching and HEP. Still needs to stretch left heel cord.  2. Continue zanaflex and vesicare. 3. Continue Ritalin to 10 mg b.i.d. #60.  This continues to be effective . second rx given for next month  -Patient remains compliant with our treatment program.  Drug swab collected today for confirmation 4. All questions were encouraged and answered. 15 minutes were spent with the patient today.    Follow up in 4 months. Greater than 50% of time during this encounter was spent counseling patient/family in regard to vocational endeavors.

## 2017-11-28 LAB — DRUG TOX METHYLPHEN W/CONF,ORAL FLD
METHYLPHENIDATE: 40.7 ng/mL — AB (ref <1.0)
Methylphenidate: POSITIVE ng/mL — AB (ref <1.0)

## 2017-12-03 ENCOUNTER — Telehealth: Payer: Self-pay | Admitting: *Deleted

## 2017-12-03 NOTE — Telephone Encounter (Signed)
Oral swab drug screen was consistent for prescribed medications.  ?

## 2018-01-20 ENCOUNTER — Other Ambulatory Visit: Payer: Self-pay | Admitting: Physical Medicine & Rehabilitation

## 2018-01-20 DIAGNOSIS — N319 Neuromuscular dysfunction of bladder, unspecified: Secondary | ICD-10-CM

## 2018-01-20 DIAGNOSIS — S069X5A Unspecified intracranial injury with loss of consciousness greater than 24 hours with return to pre-existing conscious level, initial encounter: Secondary | ICD-10-CM

## 2018-01-21 ENCOUNTER — Telehealth: Payer: Self-pay | Admitting: Registered Nurse

## 2018-01-21 MED ORDER — METHYLPHENIDATE HCL 10 MG PO TABS: 10.0000 mg | ORAL_TABLET | Freq: Two times a day (BID) | ORAL | 0 refills | Status: DC

## 2018-01-21 NOTE — Telephone Encounter (Signed)
Return Ms. Asbury call, Methylphenidate will be e-scribed. According to PMP Aware Web-site last prescription picked up on 12/26/2017. Her next scheduled visit is on 03/25/2018. She verbalizes understanding.

## 2018-02-25 ENCOUNTER — Telehealth: Payer: Self-pay | Admitting: *Deleted

## 2018-02-25 NOTE — Telephone Encounter (Signed)
Patient left a message asking for a refill on her ritalin

## 2018-02-26 MED ORDER — METHYLPHENIDATE HCL 10 MG PO TABS: 10.0000 mg | ORAL_TABLET | Freq: Two times a day (BID) | ORAL | 0 refills | Status: DC

## 2018-02-26 NOTE — Telephone Encounter (Signed)
Patient notified

## 2018-02-26 NOTE — Telephone Encounter (Signed)
rxs sent to pharmacy

## 2018-02-27 NOTE — Telephone Encounter (Signed)
Called patient to see if she could pick up medication at different location rather then come back to pick up paper script, was informed that she went by her [facility] and was able to receive her medication from them.  Called [facility] and cancelled the order and removed them from patients pharmacy list per patients request.

## 2018-02-27 NOTE — Telephone Encounter (Signed)
Someone will need to write the rx at the office if she wants to pick it up before Monday. thx

## 2018-02-27 NOTE — Telephone Encounter (Signed)
Patient came by asking for a hard copy despite the voicemail message stating that it was escribed.  I contacted the pharmacy in [place] where it was sent.  They are out of stock.  Will not be restocked until Monday.  Patient is out of meds according to PMP Aware

## 2018-03-12 ENCOUNTER — Other Ambulatory Visit: Payer: Self-pay | Admitting: Physical Medicine & Rehabilitation

## 2018-03-25 ENCOUNTER — Encounter: Payer: Self-pay | Admitting: Physical Medicine & Rehabilitation

## 2018-03-25 ENCOUNTER — Encounter: Payer: Medicare Other | Attending: Physical Medicine & Rehabilitation | Admitting: Physical Medicine & Rehabilitation

## 2018-03-25 VITALS — BP 125/87 | HR 105 | Ht 64.0 in | Wt 154.0 lb

## 2018-03-25 DIAGNOSIS — Z09 Encounter for follow-up examination after completed treatment for conditions other than malignant neoplasm: Secondary | ICD-10-CM | POA: Insufficient documentation

## 2018-03-25 DIAGNOSIS — Z9889 Other specified postprocedural states: Secondary | ICD-10-CM | POA: Diagnosis not present

## 2018-03-25 DIAGNOSIS — M7502 Adhesive capsulitis of left shoulder: Secondary | ICD-10-CM | POA: Insufficient documentation

## 2018-03-25 DIAGNOSIS — Z87891 Personal history of nicotine dependence: Secondary | ICD-10-CM | POA: Insufficient documentation

## 2018-03-25 DIAGNOSIS — F419 Anxiety disorder, unspecified: Secondary | ICD-10-CM | POA: Insufficient documentation

## 2018-03-25 DIAGNOSIS — L84 Corns and callosities: Secondary | ICD-10-CM | POA: Insufficient documentation

## 2018-03-25 DIAGNOSIS — Z87820 Personal history of traumatic brain injury: Secondary | ICD-10-CM | POA: Insufficient documentation

## 2018-03-25 DIAGNOSIS — S069X4S Unspecified intracranial injury with loss of consciousness of 6 hours to 24 hours, sequela: Secondary | ICD-10-CM

## 2018-03-25 DIAGNOSIS — G8114 Spastic hemiplegia affecting left nondominant side: Secondary | ICD-10-CM

## 2018-03-25 MED ORDER — METHYLPHENIDATE HCL 10 MG PO TABS: 10.0000 mg | ORAL_TABLET | Freq: Two times a day (BID) | ORAL | 0 refills | Status: DC

## 2018-03-25 NOTE — Progress Notes (Signed)
Subjective:    Patient ID: [name], female    DOB: [birth year], 37 y.o.   MRN: [id number]  HPI   [name] is here in follow up her TBI. She continues to do well. She maintains her job at [employer] which she loves. When she's not working she's "working out". She works out 3 days a week or so.  She still lives with her father although she states that he will be moving out sometime in the near future.  She has not gotten to the point with her boyfriend that she is ready to move them with him.   She feels that her left leg and arm have been fairly stable.  She still favors the leg somewhat with walking but has largely adjusted to it.      . Pain Inventory Average Pain 2 Pain Right Now 0 My pain is dull and aching  In the last 24 hours, has pain interfered with the following? General activity 1 Relation with others 0 Enjoyment of life 0 What TIME of day is your pain at its worst? evening Sleep (in general) Fair  Pain is worse with: inactivity Pain improves with: rest, heat/ice, therapy/exercise, pacing activities and medication Relief from Meds: 8  Mobility walk without assistance ability to climb steps?  yes do you drive?  yes  Function employed # of hrs/week 30  Neuro/Psych anxiety  Prior Studies Any changes since last visit?  no  Physicians involved in your care Any changes since last visit?  no   No family history on file. Social History   Socioeconomic History  . Marital status: Single    Spouse name: Not on file  . Number of children: Not on file  . Years of education: Not on file  . Highest education level: Not on file  Occupational History  . Not on file  Social Needs  . Financial resource strain: Not on file  . Food insecurity:    Worry: Not on file    Inability: Not on file  . Transportation needs:    Medical: Not on file    Non-medical: Not on file  Tobacco Use  . Smoking status: Former Smoker    Last attempt to quit: 09/25/2011    Years  since quitting: 6.5  . Smokeless tobacco: Never Used  Substance and Sexual Activity  . Alcohol use: Not on file  . Drug use: Not on file  . Sexual activity: Not on file  Lifestyle  . Physical activity:    Days per week: Not on file    Minutes per session: Not on file  . Stress: Not on file  Relationships  . Social connections:    Talks on phone: Not on file    Gets together: Not on file    Attends religious service: Not on file    Active member of club or organization: Not on file    Attends meetings of clubs or organizations: Not on file    Relationship status: Not on file  Other Topics Concern  . Not on file  Social History Narrative  . Not on file   Past Surgical History:  Procedure Laterality Date  . shunt placed  in head  2011   Past Medical History:  Diagnosis Date  . Intracranial injury of other and unspecified nature, without mention of open intracranial wound, unspecified state of consciousness   . Personality change due to conditions classified elsewhere   . Spastic hemiplegia affecting nondominant side (HCC)  There were no vitals taken for this visit.  Opioid Risk Score:   Fall Risk Score:  `1  Depression screen PHQ 2/9  Depression screen [facility] 2/9 11/25/2017 07/25/2015 02/03/2015  Decreased Interest 0 0 0  Down, Depressed, Hopeless 0 0 0  PHQ - 2 Score 0 0 0  Altered sleeping - - 0  Tired, decreased energy - - 0  Change in appetite - - 0  Feeling bad or failure about yourself  - - 0  Trouble concentrating - - 1  Moving slowly or fidgety/restless - - 0  Suicidal thoughts - - 0  PHQ-9 Score - - 1     Review of Systems  Constitutional: Negative.   HENT: Negative.   Eyes: Negative.   Respiratory: Negative.   Cardiovascular: Negative.   Gastrointestinal: Negative.   Endocrine: Negative.   Genitourinary: Negative.   Musculoskeletal: Negative.   Skin: Negative.   Allergic/Immunologic: Negative.   Neurological: Negative.   Hematological: Negative.    Psychiatric/Behavioral: The patient is nervous/anxious.   All other systems reviewed and are negative.      Objective:   Physical Exam  General: No acute distress HEENT: EOMI, oral membranes moist Cards: reg rate  Chest: normal effort Abdomen: Soft, NT, ND Skin: dry, intact Extremities: no edema Alert and appropriate. She is oriented x2. Spasticity in the left leg 1/4. LUE motor is 4-/5. Shoulder is loose. Left shoulder ROM has improved quite a bit. LLE is 3+ to 4/5 prox to 3 distally--stable. Tone at the ankle is 1+ with plantar flexion and inversion. Marland Kitchen Equinovarus def LLE, circumducts to help clear the foot during swing.   LLE dtr's are 2-3+. Cranial nerve exam grossly intact. Cognitively displays good insight and awareness. can be a little tangential at times  Skin:intact.    Assessment & Plan:   ASSESSMENT:  1. Traumatic brain injury with spastic left hemiparesis and ongoing cognitive deficits although she continues to show behavioral and cognitive progress 2. Frozen left shoulder  3. Metatarsal calluses, right 3rd MT head, left 5th MT head   PLAN:  1. Continue stretching and HEP. Still needs to stretch left heel cord.  2. Continue zanaflex and vesicare. 3. Continue Ritalin to 10 mg b.i.d. #60.This continues to be effective . second rx written for next month            -We will continue the controlled substance monitoring program, this consists of regular clinic visits, examinations, routine drug screening, pill counts as well as use of [place] Controlled Substance Reporting System. NCCSRS was reviewed today.    4. All questions were encouraged and answered.   Follow up in4months. Fifteen minutes of face to face patient care time were spent during this visit. All questions were encouraged and answered.  Greater than 50% of time during this encounter was spent counseling patient/family in regard to vocational issues, gait mechanics.

## 2018-03-25 NOTE — Patient Instructions (Signed)
PLEASE FEEL FREE TO CALL OUR OFFICE WITH ANY PROBLEMS OR QUESTIONS ([phone])      

## 2018-04-03 ENCOUNTER — Other Ambulatory Visit: Payer: Self-pay | Admitting: Physical Medicine & Rehabilitation

## 2018-04-03 DIAGNOSIS — G8114 Spastic hemiplegia affecting left nondominant side: Secondary | ICD-10-CM

## 2018-04-10 DIAGNOSIS — Z01419 Encounter for gynecological examination (general) (routine) without abnormal findings: Secondary | ICD-10-CM | POA: Diagnosis not present

## 2018-04-10 DIAGNOSIS — Z30431 Encounter for routine checking of intrauterine contraceptive device: Secondary | ICD-10-CM | POA: Diagnosis not present

## 2018-04-12 ENCOUNTER — Other Ambulatory Visit: Payer: Self-pay | Admitting: Physical Medicine & Rehabilitation

## 2018-04-12 DIAGNOSIS — N319 Neuromuscular dysfunction of bladder, unspecified: Secondary | ICD-10-CM

## 2018-04-12 DIAGNOSIS — S069X5A Unspecified intracranial injury with loss of consciousness greater than 24 hours with return to pre-existing conscious level, initial encounter: Secondary | ICD-10-CM

## 2018-04-28 ENCOUNTER — Telehealth: Payer: Self-pay

## 2018-04-28 NOTE — Telephone Encounter (Signed)
Pt called requesting refill on Ritalin. I called [facility] and they have a prescription on fill. Pt informed.

## 2018-05-27 ENCOUNTER — Telehealth: Payer: Self-pay

## 2018-05-27 DIAGNOSIS — S069X4S Unspecified intracranial injury with loss of consciousness of 6 hours to 24 hours, sequela: Secondary | ICD-10-CM

## 2018-05-27 MED ORDER — METHYLPHENIDATE HCL 10 MG PO TABS: 10.0000 mg | ORAL_TABLET | Freq: Two times a day (BID) | ORAL | 0 refills | Status: DC

## 2018-05-27 NOTE — Telephone Encounter (Signed)
Dr. Riley KillSwartz note reviewed, last Ritalin was picked up on 04/28/2018. Prescription sent for July and August, next scheduled appointment is 07/23/2018.    Call placed to Ms. Windholz regarding the above, no answer left message.

## 2018-05-27 NOTE — Telephone Encounter (Signed)
Pt called requesting refill for Methylphenidate 10 mg. Last filled #60 on 04/28/18, next appt 07/23/18 with Dr. Riley KillSwartz

## 2018-06-25 ENCOUNTER — Telehealth: Payer: Self-pay | Admitting: *Deleted

## 2018-06-25 NOTE — Telephone Encounter (Signed)
Patient left a message asking for Ritalin refill. She also states she will be at work [employer].

## 2018-06-25 NOTE — Telephone Encounter (Signed)
Contacted pharmacy.  Pharmacy instructed to have patient call in. Contacted patient and notified

## 2018-06-25 NOTE — Telephone Encounter (Signed)
She has an rx with [facility] b/f of 8/20

## 2018-07-10 ENCOUNTER — Other Ambulatory Visit: Payer: Self-pay | Admitting: Physical Medicine & Rehabilitation

## 2018-07-10 DIAGNOSIS — N319 Neuromuscular dysfunction of bladder, unspecified: Secondary | ICD-10-CM

## 2018-07-10 DIAGNOSIS — S069X5A Unspecified intracranial injury with loss of consciousness greater than 24 hours with return to pre-existing conscious level, initial encounter: Secondary | ICD-10-CM

## 2018-07-21 ENCOUNTER — Encounter: Payer: Medicare Other | Attending: Physical Medicine & Rehabilitation | Admitting: Physical Medicine & Rehabilitation

## 2018-07-21 ENCOUNTER — Encounter: Payer: Self-pay | Admitting: Physical Medicine & Rehabilitation

## 2018-07-21 VITALS — BP 118/81 | HR 83 | Ht 64.0 in | Wt 149.2 lb

## 2018-07-21 DIAGNOSIS — S069X4S Unspecified intracranial injury with loss of consciousness of 6 hours to 24 hours, sequela: Secondary | ICD-10-CM | POA: Diagnosis not present

## 2018-07-21 DIAGNOSIS — S069X0A Unspecified intracranial injury without loss of consciousness, initial encounter: Secondary | ICD-10-CM | POA: Diagnosis not present

## 2018-07-21 DIAGNOSIS — Z87891 Personal history of nicotine dependence: Secondary | ICD-10-CM | POA: Insufficient documentation

## 2018-07-21 DIAGNOSIS — X58XXXA Exposure to other specified factors, initial encounter: Secondary | ICD-10-CM | POA: Insufficient documentation

## 2018-07-21 DIAGNOSIS — G8114 Spastic hemiplegia affecting left nondominant side: Secondary | ICD-10-CM

## 2018-07-21 DIAGNOSIS — M7502 Adhesive capsulitis of left shoulder: Secondary | ICD-10-CM | POA: Insufficient documentation

## 2018-07-21 DIAGNOSIS — L84 Corns and callosities: Secondary | ICD-10-CM | POA: Insufficient documentation

## 2018-07-21 MED ORDER — METHYLPHENIDATE HCL 10 MG PO TABS: 10.0000 mg | ORAL_TABLET | Freq: Two times a day (BID) | ORAL | 0 refills | Status: DC

## 2018-07-21 NOTE — Patient Instructions (Signed)
CALL FOR REFILLS OF RITALIN IN EARLY NOVEMBE.R

## 2018-07-21 NOTE — Progress Notes (Signed)
Subjective:    Patient ID: [name], female    DOB: 27-Apr-1981, 37 y.o.   MRN: [id number]  HPI   [name] is here in follow up of her TBI and associated deficits. 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 She broke up with her long time boyfriend but seems to be doing fairly well at present. She has a roommate.   She uses ritalin for her attention which allows her to be vocational and function more efficiently on a day to day basis.   Pain Inventory Average Pain 1 Pain Right Now 0 My pain is dull  In the last 24 hours, has pain interfered with the following? General activity 0 Relation with others 0 Enjoyment of life 0 What TIME of day is your pain at its worst? na Sleep (in general) na  Pain is worse with: na Pain improves with: na Relief from Meds: na  Mobility walk without assistance ability to climb steps?  yes do you drive?  yes  Function employed # of hrs/week 25  Neuro/Psych confusion  Prior Studies Any changes since last visit?  no  Physicians involved in your care Any changes since last visit?  no   No family history on file. Social History   Socioeconomic History  . Marital status: Single    Spouse name: Not on file  . Number of children: Not on file  . Years of education: Not on file  . Highest education level: Not on file  Occupational History  . Not on file  Social Needs  . Financial resource strain: Not on file  . Food insecurity:    Worry: Not on file    Inability: Not on file  . Transportation needs:    Medical: Not on file    Non-medical: Not on file  Tobacco Use  . Smoking status: Former Smoker    Last attempt to quit: 09/25/2011    Years since quitting: 6.8  . Smokeless tobacco: Never Used  Substance and Sexual Activity  . Alcohol use: Not on file  . Drug use: Not on file  . Sexual activity: Not on file  Lifestyle  . Physical activity:    Days per week: Not on file    Minutes per session: Not  on file  . Stress: Not on file  Relationships  . Social connections:    Talks on phone: Not on file    Gets together: Not on file    Attends religious service: Not on file    Active member of club or organization: Not on file    Attends meetings of clubs or organizations: Not on file    Relationship status: Not on file  Other Topics Concern  . Not on file  Social History Narrative  . Not on file   Past Surgical History:  Procedure Laterality Date  . shunt placed  in head  2011   Past Medical History:  Diagnosis Date  . Intracranial injury of other and unspecified nature, without mention of open intracranial wound, unspecified state of consciousness   . Personality change due to conditions classified elsewhere   . Spastic hemiplegia affecting nondominant side (HCC)    BP 118/81   Pulse 83   Ht 5\' 4"  (1.626 m)   Wt 149 lb 3.2 oz (67.7 kg)   SpO2 97%   BMI 25.61 kg/m   Opioid Risk Score:   Fall Risk Score:  `1  Depression screen PHQ  2/9  Dep[facility] screen [facility] 2/9 11/25/2017 07/25/2015 02/03/2015  Decreased Interest 0 0 0  Down, Depressed, Hopeless 0 0 0  PHQ - 2 Score 0 0 0  Altered sleeping - - 0  Tired, decreased energy - - 0  Change in appetite - - 0  Feeling bad or failure about yourself  - - 0  Trouble concentrating - - 1  Moving slowly or fidgety/restless - - 0  Suicidal thoughts - - 0  PHQ-9 Score - - 1     Review of Systems  Constitutional: Negative.   HENT: Negative.   Eyes: Negative.   Respiratory: Negative.   Cardiovascular: Negative.   Gastrointestinal: Negative.   Endocrine: Negative.   Genitourinary: Negative.   Musculoskeletal: Negative.   Skin: Negative.   Allergic/Immunologic: Negative.   Neurological: Negative.   Hematological: Negative.   Psychiatric/Behavioral: Positive for confusion.  All other systems reviewed and are negative.      Objective:   Physical Exam  General: No acute distress HEENT: EOMI, oral membranes  moist Cards: reg rate  Chest: normal effort Abdomen: Soft, NT, ND Skin: dry, intact Extremities: no edema Alert and appropriate. She is oriented x2. Spasticity in the left leg 1/4. LUE motor is 4-/5. Shoulder ROM is more functional. Left shoulder ROM has improved quite a bit. LLE is 3+ to 4/5 prox to 3 distally--stable. Tone at the ankle is 1+ with plantar flexion and inversion. . Mild circumduction with LLE during gait. LLE dtr's are 2-3+. Cranial nerve exam grossly intact. Cognitively displays good insight and awareness.   Skin:intact.  PSYCH: pleasant as always   Assessment & Plan:   ASSESSMENT:  1. Traumatic brain injury with spastic left hemiparesis and ongoing cognitive deficits although she continues to show behavioral and cognitive progress 2. Frozen left shoulder  3. Metatarsal calluses, right 3rd MT head, left 5th MT head   PLAN:  1. Continue stretching and HEP.Still needs to stretch left heel cord. 2. Continue zanaflex and vesicare. 3. Continue Ritalin to 10 mg b.i.d. #60.This continues to be effective. Second rx written for next month -We will continue the controlled substance monitoring program, this consists of regular clinic visits, examinations, routine drug screening, pill counts as well as use of [place] Controlled Sub[place] Reporting [place]. [place].    -she may call for RF in 2 months.  4. All questions were encouraged and answered.   Follow up in354months. Fifteen minutes of face to face patient care time were spent during this visit. All questions were encouraged and answered.  Greater than 50% of time during this encounter was spent counseling patient/family in regard to HEP considerations and safety.

## 2018-07-22 ENCOUNTER — Telehealth: Payer: Self-pay | Admitting: *Deleted

## 2018-07-22 NOTE — Telephone Encounter (Signed)
[name] called because [place] and [facility] cannot fill Rx yet. I spoke with pharmacy and the earliest they can fill is tomorrow because it was picked up 8/22.  [name] says she will be ok if she just takes one a day until she gets back.  Her other alternative is to call from beach with the [facility] and the Rx could be sent there if she has to have it.  She said ok.

## 2018-07-23 ENCOUNTER — Encounter: Payer: Medicare Other | Admitting: Physical Medicine & Rehabilitation

## 2018-08-22 ENCOUNTER — Telehealth: Payer: Self-pay | Admitting: *Deleted

## 2018-08-22 DIAGNOSIS — S069X4S Unspecified intracranial injury with loss of consciousness of 6 hours to 24 hours, sequela: Secondary | ICD-10-CM

## 2018-08-22 NOTE — Telephone Encounter (Signed)
Please double check on these refill calls to make sure the patient doesn't already have a post-dated rx at pharmacy.  Last rx says DNF b/f 08/18/18

## 2018-08-22 NOTE — Telephone Encounter (Signed)
Contacted pharnacy

## 2018-08-22 NOTE — Telephone Encounter (Signed)
Patient left a message asking for ritalin refill

## 2018-08-31 ENCOUNTER — Other Ambulatory Visit: Payer: Self-pay | Admitting: Physical Medicine & Rehabilitation

## 2018-09-22 ENCOUNTER — Telehealth: Payer: Self-pay | Admitting: *Deleted

## 2018-09-22 DIAGNOSIS — S069X4S Unspecified intracranial injury with loss of consciousness of 6 hours to 24 hours, sequela: Secondary | ICD-10-CM

## 2018-09-22 NOTE — Telephone Encounter (Signed)
Patient left a message asking for her next scripts for Ritalin to be sent into her pharmacy

## 2018-09-23 ENCOUNTER — Other Ambulatory Visit: Payer: Self-pay | Admitting: *Deleted

## 2018-09-23 MED ORDER — METHYLPHENIDATE HCL 10 MG PO TABS: 10.0000 mg | ORAL_TABLET | Freq: Two times a day (BID) | ORAL | 0 refills | Status: DC

## 2018-09-23 NOTE — Telephone Encounter (Signed)
done

## 2018-09-23 NOTE — Telephone Encounter (Signed)
Notified Mariyanna.

## 2018-10-20 ENCOUNTER — Telehealth: Payer: Self-pay

## 2018-10-20 NOTE — Telephone Encounter (Signed)
Pt called back and has been notified.

## 2018-10-20 NOTE — Telephone Encounter (Signed)
Pt called requesting refill for Methylphenidate. [place] [facility] [place] and they have a prescription on file for pt. Tried calling pt to inform but no answer.

## 2018-11-19 ENCOUNTER — Encounter: Payer: Medicare Other | Admitting: Physical Medicine & Rehabilitation

## 2018-11-25 ENCOUNTER — Encounter: Payer: Medicare Other | Attending: Physical Medicine & Rehabilitation | Admitting: Physical Medicine & Rehabilitation

## 2018-11-25 ENCOUNTER — Encounter: Payer: Self-pay | Admitting: Physical Medicine & Rehabilitation

## 2018-11-25 VITALS — BP 121/80 | HR 91 | Ht 64.0 in | Wt 138.0 lb

## 2018-11-25 DIAGNOSIS — G894 Chronic pain syndrome: Secondary | ICD-10-CM | POA: Insufficient documentation

## 2018-11-25 DIAGNOSIS — S069X4S Unspecified intracranial injury with loss of consciousness of 6 hours to 24 hours, sequela: Secondary | ICD-10-CM

## 2018-11-25 DIAGNOSIS — G8114 Spastic hemiplegia affecting left nondominant side: Secondary | ICD-10-CM | POA: Diagnosis not present

## 2018-11-25 MED ORDER — METHYLPHENIDATE HCL 10 MG PO TABS: 10.0000 mg | ORAL_TABLET | Freq: Two times a day (BID) | ORAL | 0 refills | Status: DC

## 2018-11-25 NOTE — Progress Notes (Signed)
Subjective:    Patient ID: [name], female    DOB: July 09, 1981, 38 y.o.   MRN: [id number]  HPI   [name] is here in follow-up of her TBI and associated deficits.  Last saw her in September. For the most part she's doing well. She is still working part time at [employer]. She wants to work more.   She remains on ritalin for attention which allows her to be vocational.   Her family bought her a year memerbership to the [facility] [employer] which she is excited about.   Pain levels are manageable.    Pain Inventory Average Pain 2 Pain Right Now 0 My pain is aching  In the last 24 hours, has pain interfered with the following? General activity 1 Relation with others 0 Enjoyment of life 0 What TIME of day is your pain at its worst? daytime Sleep (in general) Good  Pain is worse with: some activites Pain improves with: rest, heat/ice, therapy/exercise, pacing activities and medication Relief from Meds: 9  Mobility walk without assistance how many minutes can you walk? 30-45 ability to climb steps?  yes do you drive?  yes transfers alone Do you have any goals in this area?  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  yes  Neuro/Psych anxiety  Prior Studies Any changes since last visit?  no  Physicians involved in your care Any changes since last visit?  no   History reviewed. No pertinent family history. Social History   Socioeconomic History  . Marital status: Single    Spouse name: Not on file  . Number of children: Not on file  . Years of education: Not on file  . Highest education level: Not on file  Occupational History  . Not on file  Social Needs  . Financial resource strain: Not on file  . Food insecurity:    Worry: Not on file    Inability: Not on file  . Transportation needs:    Medical: Not on file    Non-medical: Not on file  Tobacco Use  . Smoking status: Former Smoker    Last  attempt to quit: 09/25/2011    Years since quitting: 7.1  . Smokeless tobacco: Never Used  Substance and Sexual Activity  . Alcohol use: Not on file  . Drug use: Not on file  . Sexual activity: Not on file  Lifestyle  . Physical activity:    Days per week: Not on file    Minutes per session: Not on file  . Stress: Not on file  Relationships  . Social connections:    Talks on phone: Not on file    Gets together: Not on file    Attends religious service: Not on file    Active member of club or organization: Not on file    Attends meetings of clubs or organizations: Not on file    Relationship status: Not on file  Other Topics Concern  . Not on file  Social History Narrative  . Not on file   Past Surgical History:  Procedure Laterality Date  . shunt placed  in head  2011   Past Medical History:  Diagnosis Date  . Intracranial injury of other and unspecified nature, without mention of open intracranial wound, unspecified state of consciousness   . Personality change due to conditions classified elsewhere   . Spastic hemiplegia affecting nondominant side (HCC)  BP 121/80   Pulse 91   Ht 5\' 4"  (1.626 m)   Wt 138 lb (62.6 kg)   SpO2 96%   BMI 23.69 kg/m   Opioid Risk Score:   Fall Risk Score:  `1  Depression screen PHQ 2/9  Depression screen [facility] 2/9 11/25/2017 07/25/2015 02/03/2015  Decreased Interest 0 0 0  Down, Depressed, Hopeless 0 0 0  PHQ - 2 Score 0 0 0  Altered sleeping - - 0  Tired, decreased energy - - 0  Change in appetite - - 0  Feeling bad or failure about yourself  - - 0  Trouble concentrating - - 1  Moving slowly or fidgety/restless - - 0  Suicidal thoughts - - 0  PHQ-9 Score - - 1    Review of Systems  Constitutional: Negative.   HENT: Negative.   Eyes: Negative.   Respiratory: Negative.   Cardiovascular: Negative.   Gastrointestinal: Negative.   Endocrine: Negative.   Genitourinary: Negative.   Musculoskeletal: Negative.   Skin:  Negative.   Allergic/Immunologic: Negative.   Neurological: Negative.   Hematological: Negative.   Psychiatric/Behavioral: The patient is nervous/anxious.        Objective:   Physical Exam  General: No acute distress HEENT: EOMI, oral membranes moist Cards: reg rate  Chest: normal effort Abdomen: Soft, NT, ND Skin: dry, intact Extremities: no edema Alert and appropriate. She is oriented x2. Spasticity in the left leg 1/4. LUE motor is 4-/5. Shoulder ROM is more functional. Left shoulder ROM has improved quite a bit. LLE is 3+ to 4/5 prox to 3 distally--stable. Tone at the ankle is 1+ with plantar flexion and inversion. Marland Kitchen.left leg circumduction during gait.LLE dtr's are 2-3+. Cranial nerve exam grossly intact. Cognitively displays good insight and awareness.   Skin:intact.  PSYCH: improved affect.    Assessment & Plan:   ASSESSMENT:  1. Traumatic brain injury with spastic left hemiparesis and ongoing cognitive deficits although she continues to show behavioral and cognitive progress 2. Frozen left shoulder  3. Metatarsal calluses, right 3rd MT head, left 5th MT head   PLAN:  1. Continue stretching and HEP.Still needs to stretch left heel cord. 2. Continue zanaflex and vesicare. 3. Continue Ritalin to 10 mg b.i.d. #60.This continues to be effective. Second rxwrittenfor next month -We will continue the controlled substance monitoring program, this consists of regular clinic visits, examinations, routine drug screening, pill counts as well as use of [place] Controlled Substance Reporting System. [place] today.          -she may call for RF in 2 months.  4. All questions were encouraged and answered.   Follow up in4 months with NP. Fifteen minutes of face to face patient care time were spent during this visit. All questions were encouraged and answered.

## 2018-11-25 NOTE — Patient Instructions (Signed)
PLEASE FEEL FREE TO CALL OUR OFFICE WITH ANY PROBLEMS OR QUESTIONS ([phone])      

## 2018-12-25 ENCOUNTER — Telehealth: Payer: Self-pay | Admitting: *Deleted

## 2018-12-25 NOTE — Telephone Encounter (Signed)
Error

## 2019-01-28 ENCOUNTER — Telehealth: Payer: Self-pay | Admitting: *Deleted

## 2019-01-28 DIAGNOSIS — S069X4S Unspecified intracranial injury with loss of consciousness of 6 hours to 24 hours, sequela: Secondary | ICD-10-CM

## 2019-01-28 MED ORDER — METHYLPHENIDATE HCL 10 MG PO TABS: 10.0000 mg | ORAL_TABLET | Freq: Two times a day (BID) | ORAL | 0 refills | Status: DC

## 2019-01-28 NOTE — Telephone Encounter (Signed)
Both RX'es sent in

## 2019-01-28 NOTE — Telephone Encounter (Signed)
Left VM for Frances.

## 2019-01-28 NOTE — Telephone Encounter (Signed)
Patient called asking for ritalin refill.  Chart reviewed. She is due for her next 2 months worth of scripts

## 2019-03-26 ENCOUNTER — Encounter: Payer: Medicare Other | Admitting: Registered Nurse

## 2019-03-31 ENCOUNTER — Telehealth: Payer: Self-pay | Admitting: *Deleted

## 2019-03-31 DIAGNOSIS — S069X4S Unspecified intracranial injury with loss of consciousness of 6 hours to 24 hours, sequela: Secondary | ICD-10-CM

## 2019-03-31 MED ORDER — METHYLPHENIDATE HCL 10 MG PO TABS: 10.0000 mg | ORAL_TABLET | Freq: Two times a day (BID) | ORAL | 0 refills | Status: DC

## 2019-03-31 NOTE — Telephone Encounter (Signed)
rx'es written for this month and next

## 2019-03-31 NOTE — Telephone Encounter (Signed)
Patient called on [facility] asking for refill on her methylphenidate.  We weree closed.  She ran out yesterday and is in need of refill ASAP. She has an appt  on 04/03/2019

## 2019-04-03 ENCOUNTER — Other Ambulatory Visit: Payer: Self-pay

## 2019-04-03 ENCOUNTER — Encounter: Payer: Self-pay | Admitting: Registered Nurse

## 2019-04-03 ENCOUNTER — Encounter: Payer: Medicare Other | Attending: Registered Nurse | Admitting: Registered Nurse

## 2019-04-03 VITALS — BP 124/77 | HR 92 | Resp 14 | Wt 128.0 lb

## 2019-04-03 DIAGNOSIS — M545 Low back pain: Secondary | ICD-10-CM | POA: Insufficient documentation

## 2019-04-03 DIAGNOSIS — S069X4S Unspecified intracranial injury with loss of consciousness of 6 hours to 24 hours, sequela: Secondary | ICD-10-CM | POA: Diagnosis not present

## 2019-04-03 DIAGNOSIS — G8114 Spastic hemiplegia affecting left nondominant side: Secondary | ICD-10-CM | POA: Diagnosis not present

## 2019-04-03 DIAGNOSIS — Z79899 Other long term (current) drug therapy: Secondary | ICD-10-CM | POA: Diagnosis not present

## 2019-04-03 DIAGNOSIS — G8929 Other chronic pain: Secondary | ICD-10-CM | POA: Diagnosis not present

## 2019-04-03 DIAGNOSIS — G894 Chronic pain syndrome: Secondary | ICD-10-CM

## 2019-04-03 DIAGNOSIS — Z5181 Encounter for therapeutic drug level monitoring: Secondary | ICD-10-CM

## 2019-04-03 NOTE — Progress Notes (Signed)
Subjective:    Patient ID: [name], female    DOB: 1981/01/16, 38 y.o.   MRN: [id number]  HPI: [name] is a 38 y.o. female who returns for follow up appointment for TBI, chronic pain and medication refill. She  States her pain is located in her lower back. She rates her pain 1. Her current exercise regime is walking.   Ms. Palas states she lost her job at [employer], emotional support given.   Pain Inventory Average Pain 5 Pain Right Now 1 My pain is aching  In the last 24 hours, has pain interfered with the following? General activity 2 Relation with others 0 Enjoyment of life 2 What TIME of day is your pain at its worst? morning Sleep (in general) Fair  Pain is worse with: inactivity Pain improves with: medication Relief from Meds: 9  Mobility walk without assistance how many minutes can you walk? 30-60 ability to climb steps?  yes do you drive?  yes transfers alone Do you have any goals in this area?  yes  Function employed # of hrs/week . disabled: date disabled . Do you have any goals in this area?  yes  Neuro/Psych depression anxiety  Prior Studies Any changes since last visit?  no  Physicians involved in your care Any changes since last visit?  no   History reviewed. No pertinent family history. Social History   Socioeconomic History  . Marital status: Single    Spouse name: Not on file  . Number of children: Not on file  . Years of education: Not on file  . Highest education level: Not on file  Occupational History  . Not on file  Social Needs  . Financial resource strain: Not on file  . Food insecurity:    Worry: Not on file    Inability: Not on file  . Transportation needs:    Medical: Not on file    Non-medical: Not on file  Tobacco Use  . Smoking status: Former Smoker    Last attempt to quit: 09/25/2011    Years since quitting: 7.5  . Smokeless tobacco: Never Used  Substance and Sexual Activity  . Alcohol use: Not  on file  . Drug use: Not on file  . Sexual activity: Not on file  Lifestyle  . Physical activity:    Days per week: Not on file    Minutes per session: Not on file  . Stress: Not on file  Relationships  . Social connections:    Talks on phone: Not on file    Gets together: Not on file    Attends religious service: Not on file    Active member of club or organization: Not on file    Attends meetings of clubs or organizations: Not on file    Relationship status: Not on file  Other Topics Concern  . Not on file  Social History Narrative  . Not on file   Past Surgical History:  Procedure Laterality Date  . shunt placed  in head  2011   Past Medical History:  Diagnosis Date  . Intracranial injury of other and unspecified nature, without mention of open intracranial wound, unspecified state of consciousness   . Personality change due to conditions classified elsewhere   . Spastic hemiplegia affecting nondominant side (HCC)    BP 124/77   Pulse 92   Resp 14   Wt 128 lb (58.1 kg)   SpO2 96%   BMI 21.97 kg/m  Opioid Risk Score:   Fall Risk Score:  `1  Depression screen PHQ 2/9  Dep[facility] screen [facility] 11/25/2017 07/25/2015 02/03/2015  Decreased Interest 0 0 0  Down, Depressed, Hopeless 0 0 0  PHQ - 2 Score 0 0 0  Altered sleeping - - 0  Tired, decreased energy - - 0  Change in appetite - - 0  Feeling bad or failure about yourself  - - 0  Trouble concentrating - - 1  Moving slowly or fidgety/restless - - 0  Suicidal thoughts - - 0  PHQ-9 Score - - 1    Review of Systems  Constitutional: Negative.   HENT: Negative.   Eyes: Negative.   Respiratory: Negative.   Cardiovascular: Negative.   Gastrointestinal: Negative.   Endocrine: Negative.   Genitourinary: Negative.   Musculoskeletal: Negative.   Skin: Negative.   Allergic/Immunologic: Negative.   Neurological: Negative.   Hematological: Negative.   Psychiatric/Behavioral: Positive for dysphoric mood. The  patient is nervous/anxious.   All other systems reviewed and are negative.      Objective:   Physical Exam Vitals signs and nursing note reviewed.  Constitutional:      Appearance: Normal appearance.  Neck:     Musculoskeletal: Normal range of motion and neck supple.  Cardiovascular:     Rate and Rhythm: Normal rate and regular rhythm.     Pulses: Normal pulses.     Heart sounds: Normal heart sounds.  Pulmonary:     Effort: Pulmonary effort is normal.     Breath sounds: Normal breath sounds.  Musculoskeletal:     Comments: Normal Muscle Bulk and Muscle Testing Reveals:  Upper Extremities: Right: Full ROM and Muscle Strength 5/5  Left: Decreased ROM 90 Degrees and Muscle Strength 4/5  Back without spinal tenderness noted.  Lower Extremities: Full ROM and Muscle Strength 5/5 Arises from chair slowly Narrow Based Gait   Skin:    General: Skin is warm and dry.  Neurological:     Mental Status: She is alert and oriented to person, place, and time.  Psychiatric:        Mood and Affect: Mood normal.        Behavior: Behavior normal.           Assessment & Plan:  1. Traumatic brain injury with spastic left hemiparesis and ongoing cognitive deficits: Continue  Ritalin 10 mg BID #60. Second script was  given for the following month by Dr Riley KillSwartz. Instructed to call office in 2 months. Continue Zanaflex. 04/03/2019 2. Frozen left shoulder : Continue with Stretching exercises. 04/03/2019  15 minutes of face to face patient care time was spent during this visit. All questions were encouraged and answered.   F/U in 4 months

## 2019-05-01 ENCOUNTER — Other Ambulatory Visit: Payer: Self-pay | Admitting: Physical Medicine & Rehabilitation

## 2019-05-05 ENCOUNTER — Telehealth: Payer: Self-pay

## 2019-05-05 NOTE — Telephone Encounter (Signed)
Patient called stating she stopped taking Vesicare, She is no longer having those problems

## 2019-06-05 ENCOUNTER — Telehealth: Payer: Self-pay

## 2019-06-05 DIAGNOSIS — S069X4S Unspecified intracranial injury with loss of consciousness of 6 hours to 24 hours, sequela: Secondary | ICD-10-CM

## 2019-06-05 NOTE — Telephone Encounter (Signed)
Patient called requesting refill of methylphenidate medication.  Last script was filled on 04-29-2019 and next appointment is scheduled on 08-05-2019.

## 2019-06-08 MED ORDER — METHYLPHENIDATE HCL 10 MG PO TABS: 10.0000 mg | ORAL_TABLET | Freq: Two times a day (BID) | ORAL | 0 refills | Status: DC

## 2019-06-08 NOTE — Telephone Encounter (Signed)
2 rx'es written/sent for this month and next

## 2019-08-05 ENCOUNTER — Encounter: Payer: Medicare Other | Attending: Physical Medicine & Rehabilitation | Admitting: Physical Medicine & Rehabilitation

## 2019-08-11 ENCOUNTER — Telehealth: Payer: Self-pay | Admitting: Physical Medicine & Rehabilitation

## 2019-08-11 DIAGNOSIS — S069X4S Unspecified intracranial injury with loss of consciousness of 6 hours to 24 hours, sequela: Secondary | ICD-10-CM

## 2019-08-11 NOTE — Telephone Encounter (Signed)
Patient is calling to get a refill on Ritalin.  Please call patient at [phone].

## 2019-08-12 NOTE — Telephone Encounter (Signed)
Patient was no-show at last appointment.  No new appointment made as of yet.  Last office visit in may 2020

## 2019-08-13 MED ORDER — METHYLPHENIDATE HCL 10 MG PO TABS: 10.0000 mg | ORAL_TABLET | Freq: Two times a day (BID) | ORAL | 0 refills | Status: DC

## 2019-08-13 NOTE — Telephone Encounter (Signed)
Placed a call to Ms. Abernathy, explained she needs to be seen for a F/U appointment, she verbalizes understanding. She is scheduled for 08/25/2019 at 1:00, she verbalizes understanding. Will e-scribe her Methylphenidate today, she was instructed to keep her scheduled appointment she verbalizes understanding. PMP was reviewed, her last prescription of methylphenidate was filled on 07/09/2019.

## 2019-08-13 NOTE — Telephone Encounter (Signed)
Patient called for refill on Methylphenidate, states she took her last one today. No showed last appointment. Not made another appointment yet. Can you address in absence of Dr.Swartz?

## 2019-08-25 ENCOUNTER — Telehealth: Payer: Self-pay | Admitting: *Deleted

## 2019-08-25 ENCOUNTER — Encounter: Payer: Medicare Other | Admitting: Registered Nurse

## 2019-08-25 NOTE — Telephone Encounter (Signed)
Patient returning phone call to [facility] about rescheduling appointment that was supposed to be today. Call back number [phone]

## 2019-08-25 NOTE — Telephone Encounter (Signed)
Placed a call to [name], no answer and unable to leave voicemail. Will await her call.

## 2019-09-09 ENCOUNTER — Other Ambulatory Visit: Payer: Self-pay | Admitting: *Deleted

## 2019-09-09 ENCOUNTER — Encounter: Payer: Medicare Other | Admitting: Registered Nurse

## 2019-09-09 ENCOUNTER — Telehealth: Payer: Self-pay | Admitting: *Deleted

## 2019-09-09 NOTE — Telephone Encounter (Signed)
Error

## 2019-09-09 NOTE — Telephone Encounter (Addendum)
Patient left a message asking for a refill on her methylphenidate.  Her appointment with eunice 09/09/2019 was cancelled due to patient being out of town.  I contacted the patient, she states that she is at the beach visiting [place].  She only gets to see him once a year and that is now.  I explained that methylphenidate is controlled substance that requires a face to face Dr. Visit every 4 months.  I informed that we have not seen the patient since May 2020.  I informed that I could not guarantee that Dr. Naaman Plummer would refill.  She states that she has 5 tabs left. Her appointment is scheduled for 09/30/2019. I asked her what pharmacy she needed it sent to.  She initially said she was unsure.  She then stated that she would be back in town on Friday. I asked if she would be able to reschedule appt for Friday afternoon.  She stated as long as it was for the afternoon.  I rescheduled her appointment for Friday 09/11/2019 with Zella Ball at [facility].

## 2019-09-09 NOTE — Telephone Encounter (Signed)
Appointment rescheduled for 09/11/2019 at 3pm

## 2019-09-11 ENCOUNTER — Encounter: Payer: Medicare Other | Admitting: Registered Nurse

## 2019-09-14 ENCOUNTER — Encounter: Payer: Medicare Other | Admitting: Registered Nurse

## 2019-09-16 ENCOUNTER — Telehealth: Payer: Self-pay

## 2019-09-16 ENCOUNTER — Encounter: Payer: Medicare Other | Admitting: Physical Medicine & Rehabilitation

## 2019-09-16 NOTE — Telephone Encounter (Signed)
[name] needs to come in for visit to receive further ritalin.  [name] hasn't been seen since May by Zella Ball.   I hope [name] feels better soon!

## 2019-09-16 NOTE — Telephone Encounter (Signed)
Ptn called and states she is sick - fever upset stomach - told her cannot be refilled without visit.  She has just run out and wants to know if she can wait two weeks - her number [phone] if you need to call her back.

## 2019-09-17 ENCOUNTER — Telehealth: Payer: Self-pay

## 2019-09-17 ENCOUNTER — Other Ambulatory Visit: Payer: Self-pay | Admitting: Registered Nurse

## 2019-09-17 DIAGNOSIS — S069X4S Unspecified intracranial injury with loss of consciousness of 6 hours to 24 hours, sequela: Secondary | ICD-10-CM

## 2019-09-17 MED ORDER — METHYLPHENIDATE HCL 10 MG PO TABS: 10.0000 mg | ORAL_TABLET | Freq: Two times a day (BID) | ORAL | 0 refills | Status: DC

## 2019-09-17 NOTE — Telephone Encounter (Signed)
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  There is confusion (apparently mine) as to whether you wanted to have Zella Ball give her a bridge  RX until she can see you in 2 weeks.  I thought the conversation was that you wanted to see her in person and Zella Ball does not feel comfortable giving more than a bridge as she has not been here since May.  Please advise.  I need to call the patient back and let her know what is happening.

## 2019-09-17 NOTE — Telephone Encounter (Signed)
Dr. Naaman Plummer responded to this provider message. We will prescribe Ms. Achenbach a two week prescription of her Ritalin and her appointment was changed to 09/24/2019 with Bayard Hugger ANP-C at 3:20. She is aware to be here at 3:00, she verbalizes understanding.

## 2019-09-17 NOTE — Telephone Encounter (Signed)
Message was sent to Dr. Naaman Plummer regarding Ms. Ibsen refill, awaiting Dr. Naaman Plummer response to this provider message.

## 2019-09-18 ENCOUNTER — Ambulatory Visit: Payer: Medicare Other | Admitting: Registered Nurse

## 2019-09-24 ENCOUNTER — Encounter: Payer: Medicare Other | Admitting: Registered Nurse

## 2019-09-24 ENCOUNTER — Telehealth: Payer: Self-pay

## 2019-09-24 NOTE — Telephone Encounter (Signed)
ptn called and states no one available to bring her to appt - she is not feeling well - I explained that she must be seen to get medication - she said she understands that but has no way or help to get here - I explained if she continues to not show up for appts she could be discharged from practice - she said she also understands that - but she has no choice.  Not working/No insurance/no family support that she mentions/cannot focus without the medication - please advise.  She currently has no appt on calendar.

## 2019-09-30 ENCOUNTER — Ambulatory Visit: Payer: Medicare Other | Admitting: Registered Nurse

## 2019-10-07 ENCOUNTER — Ambulatory Visit: Payer: Medicare Other | Admitting: Physical Medicine & Rehabilitation

## 2019-10-07 ENCOUNTER — Ambulatory Visit: Payer: Medicare Other | Admitting: Registered Nurse

## 2019-10-22 ENCOUNTER — Encounter: Payer: Medicare Other | Attending: Physical Medicine & Rehabilitation | Admitting: Registered Nurse

## 2020-01-10 ENCOUNTER — Other Ambulatory Visit: Payer: Self-pay | Admitting: Physical Medicine & Rehabilitation

## 2020-03-16 ENCOUNTER — Encounter: Payer: Medicare Other | Attending: Physical Medicine & Rehabilitation | Admitting: Physical Medicine & Rehabilitation

## 2021-05-29 ENCOUNTER — Telehealth: Payer: Self-pay | Admitting: Physical Medicine & Rehabilitation

## 2021-05-29 NOTE — Telephone Encounter (Signed)
[name] and her Dad called asking for an appt. Her disability and all her income has been terminated because she needs paperwork filled out for recertification. Please advise

## 2021-05-31 ENCOUNTER — Encounter: Payer: Medicare HMO | Admitting: Physical Medicine & Rehabilitation

## 2021-05-31 ENCOUNTER — Telehealth: Payer: Self-pay

## 2021-05-31 NOTE — Telephone Encounter (Signed)
  [name] is trying to get her disability approved again. She wanted to know if you could refer her to someone in [facility]? Which is closer to [place].   Patient has an appointment with you in October and is on the wait list. But needing paperwork processed ASAP.    Patient has been advised to call her PCP. Or establish a PCP. Maybe they can get the process started.   Please advise.  Call back phone # [phone].

## 2021-06-01 ENCOUNTER — Telehealth: Payer: Self-pay

## 2021-06-01 NOTE — Telephone Encounter (Signed)
Please advise patient/father of this.

## 2021-06-01 NOTE — Telephone Encounter (Signed)
As I do not perform disability evaluation, I would recommend patient establish with a PCP who is accustomed to disability evaluation if she is looking for form completion.  If she has any non-surgical musculoskeletal needs outside of the above, I would be more than happy to see her in the office.

## 2021-06-01 NOTE — Telephone Encounter (Signed)
Copied from CRM [id number]. Topic: Appointment Scheduling - Scheduling Inquiry for Clinic >> May 31, 2021  4:26 PM Elliot Gault wrote: Reason for CRM: patient scheduled a NPA on 06/07/2021 with Dr. Ashley Royalty, caller (patient and father) states patient is disable and does think a sports medicine doctor would be a good fit but also needs a primary care doctor. Patient primary concern at this time is PCP filling out disability forms and wanted to give PCP a heads up.

## 2021-06-06 NOTE — Telephone Encounter (Signed)
Patient informed. 

## 2021-06-07 ENCOUNTER — Other Ambulatory Visit: Payer: Self-pay

## 2021-06-07 ENCOUNTER — Encounter: Payer: Self-pay | Admitting: Family Medicine

## 2021-06-07 ENCOUNTER — Ambulatory Visit (INDEPENDENT_AMBULATORY_CARE_PROVIDER_SITE_OTHER): Payer: Medicare HMO | Admitting: Family Medicine

## 2021-06-07 ENCOUNTER — Telehealth: Payer: Self-pay | Admitting: *Deleted

## 2021-06-07 NOTE — Telephone Encounter (Signed)
Diane, Ande's mother called because Anandi has an appt with Dr Riley Kill on 07/19/21 and she is asking that he assist in getting a mental evaluation of [name]. She is not responsive to her mother. Kaydra is homeless and she is concerned her mental status is decreasing.

## 2021-06-28 ENCOUNTER — Encounter: Payer: Medicare HMO | Attending: Physical Medicine & Rehabilitation | Admitting: Physical Medicine & Rehabilitation

## 2021-06-28 ENCOUNTER — Encounter: Payer: Self-pay | Admitting: Physical Medicine & Rehabilitation

## 2021-06-28 ENCOUNTER — Other Ambulatory Visit: Payer: Self-pay

## 2021-06-28 VITALS — BP 115/68 | HR 75 | Temp 98.4°F | Ht 64.0 in | Wt 123.0 lb

## 2021-06-28 DIAGNOSIS — S069X0S Unspecified intracranial injury without loss of consciousness, sequela: Secondary | ICD-10-CM | POA: Diagnosis not present

## 2021-06-28 DIAGNOSIS — F068 Other specified mental disorders due to known physiological condition: Secondary | ICD-10-CM | POA: Insufficient documentation

## 2021-06-28 DIAGNOSIS — S069X4S Unspecified intracranial injury with loss of consciousness of 6 hours to 24 hours, sequela: Secondary | ICD-10-CM | POA: Diagnosis not present

## 2021-06-28 DIAGNOSIS — G8114 Spastic hemiplegia affecting left nondominant side: Secondary | ICD-10-CM | POA: Diagnosis not present

## 2021-06-28 DIAGNOSIS — S069XAS Unspecified intracranial injury with loss of consciousness status unknown, sequela: Secondary | ICD-10-CM | POA: Insufficient documentation

## 2021-06-28 MED ORDER — METHYLPHENIDATE HCL 10 MG PO TABS: 10.0000 mg | ORAL_TABLET | Freq: Two times a day (BID) | ORAL | 0 refills | Status: DC

## 2021-06-28 NOTE — Progress Notes (Signed)
Subjective:    Patient ID: [name], female    DOB: Aug 10, 1981, 40 y.o.   MRN: [id number]  HPI  [name] is here in follow up of her TBI. I last saw her 2+ years ago. She lost her disability about 2 months ago. She started a job [employer] in June to help with expenses. She was released from that job because she missed some days d/t family issues.   She last took ritalin after she saw me in 2020. She has found that her concentration and memory have suffered quite a bit.   Behaviorally she's doing well. She has supportive family. She remains active exercising daily. She loves to walk. No new pain.  She's not driving because she doesn't have a working car.    Pain Inventory Average Pain 2 Pain Right Now 0 My pain is dull and aching  LOCATION OF PAIN  neck back  BOWEL Number of stools per week: normal  BLADDER Normal   Mobility walk without assistance how many minutes can you walk? 30-90 ability to climb steps?  yes do you drive?  no Do you have any goals in this area?  yes  Function disabled: date disabled 10/27/2010  Neuro/Psych trouble walking anxiety  Prior Studies Any changes since last visit?  no  Physicians involved in your care Any changes since last visit?  no   No family history on file. Social History   Socioeconomic History   Marital status: Single    Spouse name: Not on file   Number of children: Not on file   Years of education: Not on file   Highest education level: Not on file  Occupational History   Not on file  Tobacco Use   Smoking status: Former    Types: Cigarettes    Quit date: 09/25/2011    Years since quitting: 9.7   Smokeless tobacco: Never  Substance and Sexual Activity   Alcohol use: Not on file   Drug use: Not on file   Sexual activity: Not on file  Other Topics Concern   Not on file  Social History Narrative   Not on file   Social Determinants of Health   Financial Resource Strain: Not on file  Food  Insecurity: Not on file  Transportation Needs: Not on file  Physical Activity: Not on file  Stress: Not on file  Social Connections: Not on file   Past Surgical History:  Procedure Laterality Date   shunt placed  in head  2011   Past Medical History:  Diagnosis Date   Intracranial injury of other and unspecified nature, without mention of open intracranial wound, unspecified state of consciousness    Personality change due to conditions classified elsewhere    Spastic hemiplegia affecting nondominant side (HCC)    BP 115/68   Pulse 75   Temp 98.4 F (36.9 C)   Ht 5\' 4"  (1.626 m)   Wt 123 lb (55.8 kg)   SpO2 98%   BMI 21.11 kg/m   Opioid Risk Score:   Fall Risk Score:  `1  Depression screen PHQ 2/9  Depression screen [facility] 2/9 11/25/2017 07/25/2015 02/03/2015  Decreased Interest 0 0 0  Down, Depressed, Hopeless 0 0 0  PHQ - 2 Score 0 0 0  Altered sleeping - - 0  Tired, decreased energy - - 0  Change in appetite - - 0  Feeling bad or failure about yourself  - - 0  Trouble concentrating - - 1  Moving slowly or fidgety/restless - - 0  Suicidal thoughts - - 0  PHQ-9 Score - - 1    Review of Systems  Constitutional: Negative.   HENT: Negative.    Eyes: Negative.   Respiratory: Negative.    Cardiovascular: Negative.   Gastrointestinal: Negative.   Endocrine: Negative.   Genitourinary: Negative.   Musculoskeletal:  Positive for back pain and gait problem.  Skin: Negative.   Allergic/Immunologic: Negative.   Hematological: Negative.   Psychiatric/Behavioral:  Positive for decreased concentration. The patient is nervous/anxious.   All other systems reviewed and are negative.     Objective:   Physical Exam  General: No acute distress HEENT: NCAT, EOMI, oral membranes moist Cards: reg rate  Chest: normal effort Abdomen: Soft, NT, ND Skin: dry, intact Extremities: no edema Psych: pleasant and appropriate  Alert and appropriate. She is oriented x2. Marland Kitchen  LUE motor is  4-/5. Shoulder ROM is more functional. . LLE is   4/5 prox to  3/5 distally.  Tone at the ankle is trace. She wears a splint. Still with mild circumduction on left.   LLE dtr's are 2-3+. Cranial nerve exam grossly intact. Cognitively displays good insight and awareness. Ongoing attention and concentration deficits.         Assessment & Plan:     ASSESSMENT:  1. Traumatic brain injury with spastic left hemiparesis and ongoing cognitive deficits although she continues to show behavioral and cognitive progress 2. Frozen left shoulder  3. Metatarsal calluses, right 3rd MT head, left 5th MT head     PLAN:  1. HEP LLE  2. Pt did not have any disability paperwork with her. 3. Resume ritalin10mg  bid           We will continue the controlled substance monitoring program, this consists of regular clinic visits, examinations, routine drug screening, pill counts as well as use of [place] Controlled Substance Reporting System. [place] was [place] today.    Medication was refilled and a second prescription was sent to the patient's pharmacy for next month.    -can RF in 2 mos    Fifteen minutes of face to face patient care time were spent during this visit. All questions were encouraged and answered.  Follow up with me in 4 mos .

## 2021-06-28 NOTE — Patient Instructions (Signed)
PLEASE FEEL FREE TO CALL OUR OFFICE WITH ANY PROBLEMS OR QUESTIONS ([phone])      

## 2021-07-06 NOTE — Telephone Encounter (Signed)
complete

## 2021-07-19 ENCOUNTER — Ambulatory Visit: Payer: Medicare HMO | Admitting: Physical Medicine & Rehabilitation

## 2021-08-16 ENCOUNTER — Ambulatory Visit: Payer: Medicare HMO | Admitting: Physical Medicine & Rehabilitation

## 2021-08-21 ENCOUNTER — Telehealth: Payer: Self-pay

## 2021-08-21 DIAGNOSIS — S069X4S Unspecified intracranial injury with loss of consciousness of 6 hours to 24 hours, sequela: Secondary | ICD-10-CM

## 2021-08-21 DIAGNOSIS — F068 Other specified mental disorders due to known physiological condition: Secondary | ICD-10-CM

## 2021-08-21 NOTE — Telephone Encounter (Signed)
Patient is calling for a refill on Ritalin 

## 2021-08-22 MED ORDER — METHYLPHENIDATE HCL 10 MG PO TABS: 10.0000 mg | ORAL_TABLET | Freq: Two times a day (BID) | ORAL | 0 refills | Status: DC

## 2021-08-22 NOTE — Telephone Encounter (Signed)
RF for Oct/Nov written

## 2021-08-22 NOTE — Telephone Encounter (Signed)
Patient's father, Iantha Fallen, notified of refill sent in to pharmacy

## 2021-09-07 ENCOUNTER — Telehealth: Payer: Self-pay

## 2021-09-07 DIAGNOSIS — S069X4S Unspecified intracranial injury with loss of consciousness of 6 hours to 24 hours, sequela: Secondary | ICD-10-CM

## 2021-09-07 DIAGNOSIS — S069X0S Unspecified intracranial injury without loss of consciousness, sequela: Secondary | ICD-10-CM

## 2021-09-07 DIAGNOSIS — F068 Other specified mental disorders due to known physiological condition: Secondary | ICD-10-CM

## 2021-09-07 NOTE — Telephone Encounter (Signed)
Patient has moved and asked if her Methylphenidate 10 mg can be switch to [facility]. I have cancelled last prescription at [facility].

## 2021-09-08 MED ORDER — METHYLPHENIDATE HCL 10 MG PO TABS: 10.0000 mg | ORAL_TABLET | Freq: Two times a day (BID) | ORAL | 0 refills | Status: DC

## 2021-09-08 NOTE — Telephone Encounter (Signed)
Patient notified

## 2021-09-08 NOTE — Telephone Encounter (Signed)
done

## 2021-09-21 ENCOUNTER — Telehealth: Payer: Self-pay

## 2021-09-21 NOTE — Telephone Encounter (Signed)
Patient called stating she waited too long to go get her Methylphenidate and asked for Dr. Riley Kill to resend prescription.  I called [facility] and asked them to refill the prescription and to call if have any further questions. Patient notified.

## 2021-10-25 ENCOUNTER — Encounter: Payer: Self-pay | Admitting: Physical Medicine & Rehabilitation

## 2021-10-25 ENCOUNTER — Other Ambulatory Visit: Payer: Self-pay

## 2021-10-25 ENCOUNTER — Encounter: Payer: Medicare HMO | Attending: Physical Medicine & Rehabilitation | Admitting: Physical Medicine & Rehabilitation

## 2021-10-25 ENCOUNTER — Telehealth: Payer: Self-pay

## 2021-10-25 VITALS — BP 126/82 | HR 90 | Ht 64.0 in | Wt 123.0 lb

## 2021-10-25 DIAGNOSIS — S069X0S Unspecified intracranial injury without loss of consciousness, sequela: Secondary | ICD-10-CM | POA: Insufficient documentation

## 2021-10-25 DIAGNOSIS — F068 Other specified mental disorders due to known physiological condition: Secondary | ICD-10-CM | POA: Diagnosis not present

## 2021-10-25 DIAGNOSIS — G8114 Spastic hemiplegia affecting left nondominant side: Secondary | ICD-10-CM

## 2021-10-25 DIAGNOSIS — S069X4S Unspecified intracranial injury with loss of consciousness of 6 hours to 24 hours, sequela: Secondary | ICD-10-CM | POA: Insufficient documentation

## 2021-10-25 MED ORDER — METHYLPHENIDATE HCL 10 MG PO TABS: 10.0000 mg | ORAL_TABLET | Freq: Two times a day (BID) | ORAL | 0 refills | Status: DC

## 2021-10-25 NOTE — Telephone Encounter (Signed)
Patient has moved and now is using the [facility]. A verbal cancellation has been called to the [facility] pharmacy location for Rx Methylphenidate.

## 2021-10-25 NOTE — Patient Instructions (Addendum)
PLEASE FEEL FREE TO CALL OUR OFFICE WITH ANY PROBLEMS OR QUESTIONS [phone])     CHECK YOUR [employer] FORMULARY AND SEE IF THEY HAVE LONG ACTING FORMS OF RITALIN OR OTHER STIMULANTS.  (ER OR XL OR XR)

## 2021-10-25 NOTE — Progress Notes (Signed)
Subjective:    Patient ID: [name], female    DOB: 11-19-1980, 40 y.o.   MRN: [id number]  HPI  [name] is here in follow up of her TBI and its chronic effects.  I last saw her in August. She tells me that she may need to find a new place to live as her roommate will need the space to himself. She is looking at options. She doesn't have transportation. She is still seeing her boyfriend (3 years).   Patient still struggles with concentration and memory issues.  She has not been unable to carry a job due to these issues.  She is able to take care of her self at home for basic tasks.  She takes Ritalin for attention and concentration but frequently forgets to take the second dose at lunchtime.  The Ritalin does help with her deficits.    Pain Inventory Average Pain 4 Pain Right Now 0 My pain is intermittent and dull  LOCATION OF PAIN  back & head  BOWEL Number of stools per week: 7-10  BLADDER Normal I Mobility how many minutes can you walk? 30 minutes with lelt leg brace ability to climb steps?  yes do you drive?  no Do you have any goals in this area?  yes  Function disabled: date disabled 2011  Neuro/Psych confusion anxiety  Prior Studies Any changes since last visit?  no  Physicians involved in your care Any changes since last visit?  no   No family history on file. Social History   Socioeconomic History   Marital status: Single    Spouse name: Not on file   Number of children: Not on file   Years of education: Not on file   Highest education level: Not on file  Occupational History   Not on file  Tobacco Use   Smoking status: Former    Types: Cigarettes    Quit date: 09/25/2011    Years since quitting: 10.0   Smokeless tobacco: Never  Substance and Sexual Activity   Alcohol use: Not on file   Drug use: Not on file   Sexual activity: Not on file  Other Topics Concern   Not on file  Social History Narrative   Not on file   Social  Determinants of Health   Financial Resource Strain: Not on file  Food Insecurity: Not on file  Transportation Needs: Not on file  Physical Activity: Not on file  Stress: Not on file  Social Connections: Not on file   Past Surgical History:  Procedure Laterality Date   shunt placed  in head  2011   Past Medical History:  Diagnosis Date   Intracranial injury of other and unspecified nature, without mention of open intracranial wound, unspecified state of consciousness    Personality change due to conditions classified elsewhere    Spastic hemiplegia affecting nondominant side (HCC)    There were no vitals taken for this visit.  Opioid Risk Score:   Fall Risk Score:  `1  Depression screen PHQ 2/9  Depression screen [facility] 2/9 11/25/2017 07/25/2015 02/03/2015  Decreased Interest 0 0 0  Down, Depressed, Hopeless 0 0 0  PHQ - 2 Score 0 0 0  Altered sleeping - - 0  Tired, decreased energy - - 0  Change in appetite - - 0  Feeling bad or failure about yourself  - - 0  Trouble concentrating - - 1  Moving slowly or fidgety/restless - - 0  Suicidal thoughts - -  0  PHQ-9 Score - - 1     Review of Systems  Musculoskeletal:  Positive for gait problem.  Psychiatric/Behavioral:  Positive for confusion.        Anxiety  All other systems reviewed and are negative.     Objective:   Physical Exam  General: No acute distress HEENT: NCAT, EOMI, oral membranes moist Cards: reg rate  Chest: normal effort Abdomen: Soft, NT, ND Skin: dry, intact Extremities: no edema Psych: pleasant and appropriate  Alert and appropriate. She is oriented x2-e. Marland Kitchen  LUE motor is 4-/5. Shoulder ROM is more functional. . LLE is   4/5 prox to  3/5 distally.  Tone at the ankle is trace. She wears a splint. Still with mild circumduction on left.   LLE dtr's are 2-3+. Cranial nerve exam grossly intact. Cognitively displays good insight and awareness. Ongoing attention and concentration deficits during basic  conversation and during completion of basic tasks. Insight and awareness are fair but remains limited. STM is inconsistent. Needs cues from family to correctly anser biographical questions, questions about recent events.          Assessment & Plan:     ASSESSMENT:  1. Traumatic brain injury with spastic left hemiparesis and ongoing cognitive deficits although she continues to show behavioral and cognitive progress 2. Frozen left shoulder  3. Metatarsal calluses, right 3rd MT head, left 5th MT head     PLAN:  1. Continue with HEP as it pertains to LLE in particular  2. Amyriah continues to demonstrate cognitive deficits particularly in the areas of short term memory, attention, and concentration. She is able to be at home alone but cannot hold a job due to her significant deficits.   3. Continue ritalin10mg  bid.  Consider changing to long-acting form to help with consistency.           We will continue the controlled substance monitoring program, this consists of regular clinic visits, examinations, routine drug screening, pill counts as well as use of [place] Controlled Substance Reporting System. [place]CCS[place] was reviewed today.          Medication was refilled and a second prescription was sent to the patient's pharmacy for next month.          -can RF in 2 mos     15 min of face to face patient care time were spent during this visit. All questions were encouraged and answered.  Follow up with me in 4 mos.

## 2021-12-06 ENCOUNTER — Encounter: Payer: Self-pay | Admitting: *Deleted

## 2021-12-06 NOTE — Telephone Encounter (Signed)
Opened in error

## 2022-01-03 ENCOUNTER — Telehealth: Payer: Self-pay | Admitting: *Deleted

## 2022-01-03 DIAGNOSIS — S069X0S Unspecified intracranial injury without loss of consciousness, sequela: Secondary | ICD-10-CM

## 2022-01-03 DIAGNOSIS — F068 Other specified mental disorders due to known physiological condition: Secondary | ICD-10-CM

## 2022-01-03 DIAGNOSIS — S069X4S Unspecified intracranial injury with loss of consciousness of 6 hours to 24 hours, sequela: Secondary | ICD-10-CM

## 2022-01-03 MED ORDER — METHYLPHENIDATE HCL 10 MG PO TABS: 10.0000 mg | ORAL_TABLET | Freq: Two times a day (BID) | ORAL | 0 refills | Status: DC

## 2022-01-03 NOTE — Telephone Encounter (Signed)
Rx'es filled 

## 2022-01-03 NOTE — Telephone Encounter (Signed)
[name] called for her Ritalin refill. ?

## 2022-01-24 ENCOUNTER — Telehealth: Payer: Self-pay

## 2022-01-24 NOTE — Telephone Encounter (Signed)
Viewed pts chart. Pt does not see any providers in the office. ? ?KP ?

## 2022-01-24 NOTE — Telephone Encounter (Signed)
Copied from CRM [id number]. Topic: General - Inquiry ?>> Jan 24, 2022 12:35 PM Wyonia Hough E wrote: ?Reason for CRM: Pt stated she missed a call from Dr. Ashley Royalty / please advise ?

## 2022-02-07 ENCOUNTER — Telehealth: Payer: Self-pay | Admitting: *Deleted

## 2022-02-07 DIAGNOSIS — F068 Other specified mental disorders due to known physiological condition: Secondary | ICD-10-CM

## 2022-02-07 DIAGNOSIS — S069X4S Unspecified intracranial injury with loss of consciousness of 6 hours to 24 hours, sequela: Secondary | ICD-10-CM

## 2022-02-07 MED ORDER — METHYLPHENIDATE HCL 10 MG PO TABS: 10.0000 mg | ORAL_TABLET | Freq: Two times a day (BID) | ORAL | 0 refills | Status: DC

## 2022-02-07 NOTE — Telephone Encounter (Signed)
Rxs sent

## 2022-02-07 NOTE — Telephone Encounter (Signed)
[name]'s pharmacy does not have her methylphenidate.  She found them at the [facility] [address] in [place].  Please send her rx to this pharmacy. I have added pharmacy. ?

## 2022-02-23 ENCOUNTER — Encounter: Payer: Medicare HMO | Admitting: Registered Nurse

## 2022-03-01 ENCOUNTER — Encounter: Payer: 59 | Attending: Registered Nurse | Admitting: Registered Nurse

## 2022-03-01 ENCOUNTER — Encounter: Payer: Self-pay | Admitting: Registered Nurse

## 2022-03-01 VITALS — BP 133/90 | HR 81 | Ht 64.0 in | Wt 125.6 lb

## 2022-03-01 DIAGNOSIS — G8114 Spastic hemiplegia affecting left nondominant side: Secondary | ICD-10-CM | POA: Insufficient documentation

## 2022-03-01 DIAGNOSIS — S069X0S Unspecified intracranial injury without loss of consciousness, sequela: Secondary | ICD-10-CM | POA: Diagnosis present

## 2022-03-01 DIAGNOSIS — S069X4S Unspecified intracranial injury with loss of consciousness of 6 hours to 24 hours, sequela: Secondary | ICD-10-CM | POA: Diagnosis not present

## 2022-03-01 DIAGNOSIS — F068 Other specified mental disorders due to known physiological condition: Secondary | ICD-10-CM | POA: Insufficient documentation

## 2022-03-01 DIAGNOSIS — L84 Corns and callosities: Secondary | ICD-10-CM | POA: Diagnosis not present

## 2022-03-01 NOTE — Progress Notes (Signed)
? ?Subjective:  ? ? Patient ID: [name], female    DOB: [birth year], 41 y.o.   MRN: [id number] ? ?HPI: [name] is a 41 y.o. female who returns for follow up appointment for chronic pain and medication refill. She states her pain is located in her right foot ) no drainage and callous, her PCP is following and she was instructed to call her podiatrist, she verbalizes understanding. She rates her pain 0. Her current exercise regime is walking and performing stretching exercises. ? ? ?Pain Inventory ?Average Pain 3 ?Pain Right Now 0 ?My pain is dull and aching ? ?In the last 24 hours, has pain interfered with the following? ?General activity 0 ?Relation with others 0 ?Enjoyment of life 0 ?What TIME of day is your pain at its worst? night ?Sleep (in general) Good ? ?Pain is worse with: inactivity ?Pain improves with: rest, heat/ice, therapy/exercise, pacing activities, and medication ?Relief from Meds: 9 ? ?History reviewed. No pertinent family history. ?Social History  ? ?Socioeconomic History  ? Marital status: Single  ?  Spouse name: Not on file  ? Number of children: Not on file  ? Years of education: Not on file  ? Highest education level: Not on file  ?Occupational History  ? Not on file  ?Tobacco Use  ? Smoking status: Former  ?  Types: Cigarettes  ?  Quit date: 09/25/2011  ?  Years since quitting: 10.4  ? Smokeless tobacco: Never  ?Vaping Use  ? Vaping Use: Never used  ?Substance and Sexual Activity  ? Alcohol use: Not Currently  ? Drug use: Never  ? Sexual activity: Not on file  ?Other Topics Concern  ? Not on file  ?Social History Narrative  ? Not on file  ? ?Social Determinants of Health  ? ?Financial Resource Strain: Not on file  ?Food Insecurity: Not on file  ?Transportation Needs: Not on file  ?Physical Activity: Not on file  ?Stress: Not on file  ?Social Connections: Not on file  ? ?Past Surgical History:  ?Procedure Laterality Date  ? shunt placed  in head  2011  ? ?Past Surgical History:   ?Procedure Laterality Date  ? shunt placed  in head  2011  ? ?Past Medical History:  ?Diagnosis Date  ? Intracranial injury of other and unspecified nature, without mention of open intracranial wound, unspecified state of consciousness   ? Personality change due to conditions classified elsewhere   ? Spastic hemiplegia affecting nondominant side (HCC)   ? ?BP 133/90   Pulse 81   Ht 5\' 4"  (1.626 m)   Wt 125 lb 9.6 oz (57 kg)   SpO2 100%   BMI 21.56 kg/m?  ? ?Opioid Risk Score:   ?Fall Risk Score:  `1 ? ?Depression screen PHQ 2/9 ? ? ?  03/01/2022  ?  3:45 PM 10/25/2021  ?  9:57 AM 11/25/2017  ?  9:10 AM 07/25/2015  ?  8:26 AM 02/03/2015  ?  2:32 PM  ?Depression screen PHQ 2/9  ?Decreased Interest 1 0 0 0 0  ?Down, Depressed, Hopeless 1 0 0 0 0  ?PHQ - 2 Score 2 0 0 0 0  ?Altered sleeping     0  ?Tired, decreased energy     0  ?Change in appetite     0  ?Feeling bad or failure about yourself      0  ?Trouble concentrating     1  ?Moving slowly or fidgety/restless  0  ?Suicidal thoughts     0  ?PHQ-9 Score     1  ?  ? ?Review of Systems  ?Constitutional: Negative.   ?HENT: Negative.    ?Eyes: Negative.   ?Respiratory: Negative.    ?Cardiovascular: Negative.   ?Gastrointestinal: Negative.   ?Endocrine: Negative.   ?Genitourinary: Negative.   ?Musculoskeletal:  Positive for back pain.  ?Skin: Negative.   ?Allergic/Immunologic: Negative.   ?Neurological: Negative.   ?Hematological: Negative.   ?Psychiatric/Behavioral: Negative.    ? ?   ?Objective:  ? Physical Exam ?Vitals and nursing note reviewed.  ?Constitutional:   ?   Appearance: Normal appearance.  ?Cardiovascular:  ?   Rate and Rhythm: Normal rate and regular rhythm.  ?   Pulses: Normal pulses.  ?   Heart sounds: Normal heart sounds.  ?Pulmonary:  ?   Effort: Pulmonary effort is normal.  ?   Breath sounds: Normal breath sounds.  ?Musculoskeletal:  ?   Cervical back: Normal range of motion and neck supple.  ?   Comments: Normal Muscle Bulk and Muscle Testing  Reveals: ? Upper Extremities: Right: Full ROM and Muscle Strength 5/5 ?Left Upper Extremity: Decreased ROM 45 Degrees and Muscle Strength 4/5 ? Lower Extremities: Full ROM and Muscle Strength 5/5 ?Wearing Left Ankle Brace ?Arises from Table Slowly ?Narrow Based Gait  ?   ?Skin: ?   General: Skin is warm and dry.  ?Neurological:  ?   Mental Status: She is alert and oriented to person, place, and time.  ?Psychiatric:     ?   Mood and Affect: Mood normal.     ?   Behavior: Behavior normal.  ? ? ? ? ?   ?Assessment & Plan:  ?1. Traumatic brain injury with spastic left hemiparesis and ongoing cognitive deficits: Continue  Ritalin 10 mg BID #60. Second script was  given for the following month by [name]. Instructed to call office in 2 months. Continue Zanaflex. 03/01/2022 ?2. Frozen left shoulder : Continue with Stretching exercises. 03/01/2022 ?3. Foot Callus: PCP Following. [name] was instructed to call for Podiatry appointment. She verbalizes understanding.  ?   ? ?  ?F/U in 4 months ?  ?  ?  ?  ?  ?  ? ?

## 2022-03-01 NOTE — Patient Instructions (Signed)
Call and scheduled appointment with Podiatrist  ? ?[facility]) ?4.5 ?51 Google reviews ?[place] ?Get online care: [email or web address] ?Address: [address] [place], [place] [address] ?Hours:  ?Open ? Closes 5?PM ?Updated by this business 9 weeks ago ?Phone: [phone] ?

## 2022-04-10 ENCOUNTER — Telehealth: Payer: Self-pay | Admitting: *Deleted

## 2022-04-10 DIAGNOSIS — S069X4S Unspecified intracranial injury with loss of consciousness of 6 hours to 24 hours, sequela: Secondary | ICD-10-CM

## 2022-04-10 DIAGNOSIS — F068 Other specified mental disorders due to known physiological condition: Secondary | ICD-10-CM

## 2022-04-10 MED ORDER — METHYLPHENIDATE HCL 10 MG PO TABS: 10.0000 mg | ORAL_TABLET | Freq: Two times a day (BID) | ORAL | 0 refills | Status: DC

## 2022-04-10 NOTE — Telephone Encounter (Signed)
[name] called for refill on her ritalin. PMP last fill date was 03/09/22. Next appt is 06/27/22

## 2022-04-10 NOTE — Telephone Encounter (Signed)
Notified. 

## 2022-04-10 NOTE — Telephone Encounter (Signed)
Rx's sent for June/July

## 2022-06-12 ENCOUNTER — Telehealth: Payer: Self-pay

## 2022-06-12 DIAGNOSIS — S069X4S Unspecified intracranial injury with loss of consciousness of 6 hours to 24 hours, sequela: Secondary | ICD-10-CM

## 2022-06-12 DIAGNOSIS — S069X0S Unspecified intracranial injury without loss of consciousness, sequela: Secondary | ICD-10-CM

## 2022-06-12 MED ORDER — METHYLPHENIDATE HCL 10 MG PO TABS
10.0000 mg | ORAL_TABLET | Freq: Two times a day (BID) | ORAL | 0 refills | Status: DC
Start: 2022-06-12 — End: 2022-06-27

## 2022-06-12 MED ORDER — METHYLPHENIDATE HCL 10 MG PO TABS: 10.0000 mg | ORAL_TABLET | Freq: Two times a day (BID) | ORAL | 0 refills | Status: DC

## 2022-06-12 NOTE — Telephone Encounter (Signed)
Patient requesting refill on Methylphenidate. Last filled 05/14/22, next appt 06/27/22

## 2022-06-12 NOTE — Telephone Encounter (Signed)
Rx written and sent to the pharmacy. Thanks!  

## 2022-06-27 ENCOUNTER — Encounter: Payer: Self-pay | Admitting: Physical Medicine & Rehabilitation

## 2022-06-27 ENCOUNTER — Encounter: Payer: 59 | Attending: Physical Medicine & Rehabilitation | Admitting: Physical Medicine & Rehabilitation

## 2022-06-27 VITALS — BP 124/86 | HR 88 | Ht 64.0 in | Wt 126.4 lb

## 2022-06-27 DIAGNOSIS — S069X0S Unspecified intracranial injury without loss of consciousness, sequela: Secondary | ICD-10-CM | POA: Insufficient documentation

## 2022-06-27 DIAGNOSIS — M75102 Unspecified rotator cuff tear or rupture of left shoulder, not specified as traumatic: Secondary | ICD-10-CM | POA: Diagnosis present

## 2022-06-27 DIAGNOSIS — F068 Other specified mental disorders due to known physiological condition: Secondary | ICD-10-CM | POA: Insufficient documentation

## 2022-06-27 DIAGNOSIS — S069X4S Unspecified intracranial injury with loss of consciousness of 6 hours to 24 hours, sequela: Secondary | ICD-10-CM | POA: Diagnosis present

## 2022-06-27 DIAGNOSIS — G8114 Spastic hemiplegia affecting left nondominant side: Secondary | ICD-10-CM | POA: Diagnosis present

## 2022-06-27 DIAGNOSIS — R4189 Other symptoms and signs involving cognitive functions and awareness: Secondary | ICD-10-CM

## 2022-06-27 MED ORDER — METHYLPHENIDATE HCL 10 MG PO TABS: 10.0000 mg | ORAL_TABLET | Freq: Two times a day (BID) | ORAL | 0 refills | Status: DC

## 2022-06-27 NOTE — Progress Notes (Signed)
Subjective:    Patient ID: [name], female    DOB: [birth year], 41 y.o.   MRN: [id number]  HPI  [name] is here in follow up of her TBI. She is wearing an ASO on the left. She wears copper socks on both feet.  She still has to be careful with her ambulation as she can become tripped up quite easily, especially with her left foot.  She takes care of the house, makes meals and generally remains fairly active around the house.  She is not working or driving..   Patient remains on Ritalin for attention and concentration.  She takes 10 mg twice daily with breakfast and lunch.  Pain Inventory Average Pain 3 Pain Right Now 0 My pain is dull  LOCATION OF PAIN  Back and leg   BOWEL Number of stools per week: 7   BLADDER Normal    Mobility walk without assistance how many minutes can you walk? 30 ability to climb steps?  yes do you drive?  no Do you have any goals in this area?  yes  Function disabled: date disabled 10/2010  Neuro/Psych No problems in this area  Prior Studies Any changes since last visit?  no  Physicians involved in your care Any changes since last visit?  no   History reviewed. No pertinent family history. Social History   Socioeconomic History   Marital status: Single    Spouse name: Not on file   Number of children: Not on file   Years of education: Not on file   Highest education level: Not on file  Occupational History   Not on file  Tobacco Use   Smoking status: Former    Types: Cigarettes    Quit date: 09/25/2011    Years since quitting: 10.7   Smokeless tobacco: Never  Vaping Use   Vaping Use: Never used  Substance and Sexual Activity   Alcohol use: Not Currently   Drug use: Never   Sexual activity: Not on file  Other Topics Concern   Not on file  Social History Narrative   Not on file   Social Determinants of Health   Financial Resource Strain: Not on file  Food Insecurity: Not on file  Transportation Needs: Not  on file  Physical Activity: Not on file  Stress: Not on file  Social Connections: Not on file   Past Surgical History:  Procedure Laterality Date   shunt placed  in head  2011   Past Medical History:  Diagnosis Date   Intracranial injury of other and unspecified nature, without mention of open intracranial wound, unspecified state of consciousness    Personality change due to conditions classified elsewhere    Spastic hemiplegia affecting nondominant side (HCC)    Ht 5\' 4"  (1.626 m)   Wt 126 lb 6.4 oz (57.3 kg)   BMI 21.70 kg/m   Opioid Risk Score:   Fall Risk Score:  `1  Depression screen [facility] 2/9     03/01/2022    3:45 PM 10/25/2021    9:57 AM 11/25/2017    9:10 AM 07/25/2015    8:26 AM 02/03/2015    2:32 PM  Depression screen PHQ 2/9  Decreased Interest 1 0 0 0 0  Down, Depressed, Hopeless 1 0 0 0 0  PHQ - 2 Score 2 0 0 0 0  Altered sleeping     0  Tired, decreased energy     0  Change in appetite  0  Feeling bad or failure about yourself      0  Trouble concentrating     1  Moving slowly or fidgety/restless     0  Suicidal thoughts     0  PHQ-9 Score     1      Review of Systems  Musculoskeletal:  Positive for back pain.       Left leg pain  All other systems reviewed and are negative.     Objective:   Physical Exam  General: No acute distress HEENT: NCAT, EOMI, oral membranes moist Cards: reg rate  Chest: normal effort Abdomen: Soft, NT, ND Skin: dry, intact Extremities: no edema Psych: pleasant and appropriate   Alert and appropriate. She is oriented x2-e. Marland Kitchen  LUE motor is 4-/5. Shoulder ROM is more functional. . LLE is   4/5 prox to  3/5 distally.  Tone at the ankle is trace. She wears a splint. Still with mild circumduction on left.   LLE dtr's are 2-3+. Cranial nerve exam grossly intact. Cognitively displays good insight and awareness.  Has ongoing attention and concentration issues during conversation.  Needs extra time to complete tasks.  Is  better with spontaneous speech. Insight and awareness are fair but remains limited. STM is inconsistent.            Assessment & Plan:     ASSESSMENT:  1. Traumatic brain injury with spastic left hemiparesis and ongoing cognitive deficits although she continues to show behavioral and cognitive progress 2. Frozen left shoulder  3. Metatarsal calluses, right 3rd MT head, left 5th MT head     PLAN:  1.  Encouraged her to stay active around the house  2. [name] continues to demonstrate cognitive deficits particularly in the areas of short term memory, attention, and concentration. She is able to be at home alone but cannot hold a job due to her significant deficits.   3. Continue ritalin10mg  bid.  She does well with this dose.          We will continue the controlled substance monitoring program, this consists of regular clinic visits, examinations, routine drug screening, pill counts as well as use of [place] Controlled Substance Reporting System. [place].           Medication was refilled and a second prescription was sent to the patient's pharmacy for next month.          -can RF in 2 mos     15 minutes of face to face patient care time were spent during this visit. All questions were encouraged and answered.  Follow up with NP in 4 months.

## 2022-06-27 NOTE — Patient Instructions (Addendum)
PLEASE FEEL FREE TO CALL OUR OFFICE WITH ANY PROBLEMS OR QUESTIONS [phone])   I FILLED ANOTHER RITALIN PRESCRIPTION FOR EARLY SEPTEMBER AND ONE FOR THE BEGINNING OF OCTOBER.

## 2022-09-26 ENCOUNTER — Encounter: Payer: Self-pay | Admitting: Family Medicine

## 2022-09-26 ENCOUNTER — Ambulatory Visit: Payer: 59 | Admitting: Family Medicine

## 2022-09-26 NOTE — Progress Notes (Signed)
Patient did not keep appointment today. She will be called to reschedule.  

## 2022-10-24 ENCOUNTER — Telehealth: Payer: Self-pay

## 2022-10-24 ENCOUNTER — Encounter: Payer: 59 | Attending: Physical Medicine & Rehabilitation | Admitting: Physical Medicine & Rehabilitation

## 2022-10-24 DIAGNOSIS — S069X4S Unspecified intracranial injury with loss of consciousness of 6 hours to 24 hours, sequela: Secondary | ICD-10-CM

## 2022-10-24 DIAGNOSIS — F068 Other specified mental disorders due to known physiological condition: Secondary | ICD-10-CM

## 2022-10-24 MED ORDER — METHYLPHENIDATE HCL 10 MG PO TABS: 10.0000 mg | ORAL_TABLET | Freq: Two times a day (BID) | ORAL | 0 refills | Status: AC

## 2022-10-24 MED ORDER — METHYLPHENIDATE HCL 10 MG PO TABS: 10.0000 mg | ORAL_TABLET | Freq: Two times a day (BID) | ORAL | 0 refills | Status: DC

## 2022-10-24 NOTE — Telephone Encounter (Signed)
Per Dad, [name]'s disability has not be renewed.  On her behalf he is requesting  a letter to state her need, for disability.    Mr. Julian Reil has been advised to call back with the requested details to include in the letter. Also to submit any forms ASAP for completion.

## 2022-10-24 NOTE — Telephone Encounter (Signed)
I'm not writing an open letter to disability stating her needs. Inevitably, when I have done that in the past, disability or whoever the agency is has come back and requested something different than what I wrote.  Disability will need to contact us regarding specifically what is requested. Usually they request records first.   I have filled both ritalin rx'es  thx

## 2022-10-24 NOTE — Telephone Encounter (Signed)
Patient called for a refill of Methylphenidate. Per Dad she does not have any on hand. Please send to [facility] at [facility].   Filled  Written  ID  Drug  QTY  Days  Prescriber  RX #  Dispenser  Refill  Daily Dose*  Pymt Type  PMP  09/23/2022 06/27/2022 3  Methylphenidate 10 Mg Tablet 60.00 30 Za Swa [id number] [id number] 0/0  Medicare [place] 08/16/2022 06/12/2022 3  Methylphenidate 10 Mg Tablet 60.00 30 Za Swa [id number] [facility] ([id number]) 0/0  Medicare [place] 07/13/2022 06/27/2022 3  Methylphenidate 10 Mg Tablet 60.00 30 Za Swa [id number] [facility] (4612) 0/0  Medicare [place]

## 2022-10-25 NOTE — Telephone Encounter (Signed)
Dr. Riley Kill reply left on the patient voicemail.

## 2022-11-23 ENCOUNTER — Telehealth: Payer: Self-pay | Admitting: Physical Medicine & Rehabilitation

## 2022-11-23 DIAGNOSIS — S069X4S Unspecified intracranial injury with loss of consciousness of 6 hours to 24 hours, sequela: Secondary | ICD-10-CM

## 2022-11-23 DIAGNOSIS — F068 Other specified mental disorders due to known physiological condition: Secondary | ICD-10-CM

## 2022-11-23 MED ORDER — METHYLPHENIDATE HCL 10 MG PO TABS: 10.0000 mg | ORAL_TABLET | Freq: Two times a day (BID) | ORAL | 0 refills | Status: AC

## 2022-11-23 NOTE — Telephone Encounter (Signed)
Patient is calling to get a refill on Ritalin.  She doesn't have any appointments with you and she hasn't been here since August.  Do we need to schedule an appointment with Zella Ball, you don't have anything until April.

## 2022-11-23 NOTE — Telephone Encounter (Signed)
This is refilled. She will be on a q2 mos visit schedule for the near future as she was given latitude and then no-showed for her visit. She has no-showed for her last 2 visits in fact and also cancelled a visit earlier in the year. She can be scheduled with eunice.

## 2022-11-26 ENCOUNTER — Telehealth: Payer: Self-pay

## 2022-11-26 NOTE — Telephone Encounter (Signed)
Next appointment is on 12/18/2022 with Danella Sensing. Message left for patient to keep future appointments for refills.

## 2022-11-30 NOTE — Telephone Encounter (Signed)
Ask completed.

## 2022-12-18 ENCOUNTER — Encounter: Payer: Medicaid Other | Admitting: Registered Nurse

## 2022-12-20 ENCOUNTER — Encounter: Payer: Medicaid Other | Attending: Physical Medicine & Rehabilitation | Admitting: Registered Nurse

## 2022-12-20 NOTE — Progress Notes (Deleted)
   Subjective:    Patient ID: [name], female    DOB: 05/08/1981, 42 y.o.   MRN: [id number]  HPI   Pain Inventory Average Pain {NUMBERS; 0-10:5044} Pain Right Now {NUMBERS; 0-10:5044} My pain is {PAIN DESCRIPTION:21022940}  In the last 24 hours, has pain interfered with the following? General activity {NUMBERS; 0-10:5044} Relation with others {NUMBERS; 0-10:5044} Enjoyment of life {NUMBERS; 0-10:5044} What TIME of day is your pain at its worst? {time of day:24191} Sleep (in general) {BHH GOOD/FAIR/POOR:22877}  Pain is worse with: {ACTIVITIES:21022942} Pain improves with: {PAIN IMPROVES JSEG:31517616} Relief from Meds: {NUMBERS; 0-10:5044}  No family history on file. Social History   Socioeconomic History   Marital status: Single    Spouse name: Not on file   Number of children: Not on file   Years of education: Not on file   Highest education level: Not on file  Occupational History   Not on file  Tobacco Use   Smoking status: Former    Types: Cigarettes    Quit date: 09/25/2011    Years since quitting: 11.2   Smokeless tobacco: Never  Vaping Use   Vaping Use: Never used  Substance and Sexual Activity   Alcohol use: Not Currently   Drug use: Never   Sexual activity: Not on file  Other Topics Concern   Not on file  Social History Narrative   Not on file   Social Determinants of Health   Financial Resource Strain: Not on file  Food Insecurity: Not on file  Transportation Needs: Not on file  Physical Activity: Not on file  Stress: Not on file  Social Connections: Not on file   Past Surgical History:  Procedure Laterality Date   shunt placed  in head  2011   Past Surgical History:  Procedure Laterality Date   shunt placed  in head  2011   Past Medical History:  Diagnosis Date   Intracranial injury of other and unspecified nature, without mention of open intracranial wound, unspecified state of consciousness    Personality change due to  conditions classified elsewhere    Spastic hemiplegia affecting nondominant side ([place])    There were no vitals taken for this visit.  Opioid Risk Score:   Fall Risk Score:  `1  Depression screen [facility] 2/9     03/01/2022    3:45 PM 10/25/2021    9:57 AM 11/25/2017    9:10 AM 07/25/2015    8:26 AM 02/03/2015    2:32 PM  Depression screen PHQ 2/9  Decreased Interest 1 0 0 0 0  Down, Depressed, Hopeless 1 0 0 0 0  PHQ - 2 Score 2 0 0 0 0  Altered sleeping     0  Tired, decreased energy     0  Change in appetite     0  Feeling bad or failure about yourself      0  Trouble concentrating     1  Moving slowly or fidgety/restless     0  Suicidal thoughts     0  PHQ-9 Score     1    Review of Systems     Objective:   Physical Exam        Assessment & Plan:

## 2023-01-11 ENCOUNTER — Encounter: Payer: Medicare Other | Attending: Physical Medicine & Rehabilitation | Admitting: Registered Nurse

## 2023-01-11 ENCOUNTER — Encounter: Payer: Self-pay | Admitting: Registered Nurse

## 2023-01-11 VITALS — BP 129/89 | HR 86 | Ht 64.0 in | Wt 121.0 lb

## 2023-01-11 DIAGNOSIS — F068 Other specified mental disorders due to known physiological condition: Secondary | ICD-10-CM | POA: Diagnosis present

## 2023-01-11 DIAGNOSIS — G8114 Spastic hemiplegia affecting left nondominant side: Secondary | ICD-10-CM | POA: Diagnosis present

## 2023-01-11 DIAGNOSIS — S069X4S Unspecified intracranial injury with loss of consciousness of 6 hours to 24 hours, sequela: Secondary | ICD-10-CM | POA: Insufficient documentation

## 2023-01-11 DIAGNOSIS — S069X0S Unspecified intracranial injury without loss of consciousness, sequela: Secondary | ICD-10-CM | POA: Insufficient documentation

## 2023-01-11 NOTE — Progress Notes (Unsigned)
Subjective:    Patient ID: [name], female    DOB: February 09, 1981, 42 y.o.   MRN: [id number]  HPI: [name] is a 42 y.o. female who returns for follow up appointment for chronic pain and medication refill. states *** pain is located in  ***. rates pain ***. current exercise regime is walking and performing stretching exercises.  [name]    Pain Inventory Average Pain 4 Pain Right Now 0 My pain is aching  In the last 24 hours, has pain interfered with the following? General activity 2 Relation with others 0 Enjoyment of life 1 What TIME of day is your pain at its worst? evening Sleep (in general) Good  Pain is worse with: inactivity and some activites Pain improves with: rest, heat/ice, therapy/exercise, pacing activities, and medication Relief from Meds: 8  History reviewed. No pertinent family history. Social History   Socioeconomic History   Marital status: Single    Spouse name: Not on file   Number of children: Not on file   Years of education: Not on file   Highest education level: Not on file  Occupational History   Not on file  Tobacco Use   Smoking status: Former    Types: Cigarettes    Quit date: 09/25/2011    Years since quitting: 11.3   Smokeless tobacco: Never  Vaping Use   Vaping Use: Never used  Substance and Sexual Activity   Alcohol use: Not Currently   Drug use: Never   Sexual activity: Not on file  Other Topics Concern   Not on file  Social History Narrative   Not on file   Social Determinants of Health   Financial Resource Strain: Not on file  Food Insecurity: Not on file  Transportation Needs: Not on file  Physical Activity: Not on file  Stress: Not on file  Social Connections: Not on file   Past Surgical History:  Procedure Laterality Date   shunt placed  in head  2011   Past Surgical History:  Procedure Laterality Date   shunt placed  in head  2011   Past Medical History:  Diagnosis Date   Intracranial injury  of other and unspecified nature, without mention of open intracranial wound, unspecified state of consciousness    Personality change due to conditions classified elsewhere    Spastic hemiplegia affecting nondominant side (HCC)    Ht '5\' 4"'$  (1.626 m)   Wt 121 lb (54.9 kg)   BMI 20.77 kg/m   Opioid Risk Score:   Fall Risk Score:  `1  Depression screen [facility] 2/9     03/01/2022    3:45 PM 10/25/2021    9:57 AM 11/25/2017    9:10 AM 07/25/2015    8:26 AM 02/03/2015    2:32 PM  Depression screen PHQ 2/9  Decreased Interest 1 0 0 0 0  Down, Depressed, Hopeless 1 0 0 0 0  PHQ - 2 Score 2 0 0 0 0  Altered sleeping     0  Tired, decreased energy     0  Change in appetite     0  Feeling bad or failure about yourself      0  Trouble concentrating     1  Moving slowly or fidgety/restless     0  Suicidal thoughts     0  PHQ-9 Score     1      Review of Systems  Musculoskeletal:  Positive for gait problem.  Left arm pain  All other systems reviewed and are negative.      Objective:   Physical Exam        Assessment & Plan:  1. Traumatic brain injury with spastic left hemiparesis and ongoing cognitive deficits: Continue  Ritalin 10 mg BID #60. Second script was  given for the following month by Dr Naaman Plummer. Instructed to call office in 2 months. Continue Zanaflex. 03/01/2022 2. Frozen left shoulder : Continue with Stretching exercises. 03/01/2022 3. Foot Callus: PCP Following. [name] was instructed to call for Podiatry appointment. She verbalizes understanding.         F/U in 4 months

## 2023-01-15 ENCOUNTER — Telehealth: Payer: Self-pay | Admitting: *Deleted

## 2023-01-15 LAB — DRUG TOX MONITOR 1 W/CONF, ORAL FLD
Amphetamine: 324 ng/mL — ABNORMAL HIGH (ref <10)
Amphetamines: POSITIVE ng/mL — AB (ref <10)
Barbiturates: NEGATIVE ng/mL (ref <10)
Benzodiazepines: NEGATIVE ng/mL (ref <0.50)
Benzoylecgonine: >250 ng/mL — ABNORMAL HIGH (ref <5.0)
Buprenorphine: NEGATIVE ng/mL (ref <0.10)
Cocaine: 125.9 ng/mL — ABNORMAL HIGH (ref <5.0)
Cocaine: POSITIVE ng/mL — AB (ref <5.0)
Cotinine: 120.8 ng/mL — ABNORMAL HIGH (ref <5.0)
Fentanyl: NEGATIVE ng/mL (ref <0.10)
Heroin Metabolite: NEGATIVE ng/mL (ref <1.0)
MARIJUANA: NEGATIVE ng/mL (ref <2.5)
MDMA: NEGATIVE ng/mL (ref <10)
Meprobamate: NEGATIVE ng/mL (ref <2.5)
Methadone: NEGATIVE ng/mL (ref <5.0)
Methamphetamine: >500 ng/mL — ABNORMAL HIGH (ref <10)
Nicotine Metabolite: POSITIVE ng/mL — AB (ref <5.0)
Opiates: NEGATIVE ng/mL (ref <2.5)
Phencyclidine: NEGATIVE ng/mL (ref <10)
Tapentadol: NEGATIVE ng/mL (ref <5.0)
Tramadol: NEGATIVE ng/mL (ref <5.0)
Zolpidem: NEGATIVE ng/mL (ref <5.0)

## 2023-01-15 LAB — DRUG TOX ALC METAB W/CON, ORAL FLD: Alcohol Metabolite: NEGATIVE ng/mL (ref <25)

## 2023-01-15 LAB — DRUG TOX METHYLPHEN W/CONF,ORAL FLD: Methylphenidate: NEGATIVE ng/mL (ref <1.0)

## 2023-01-15 NOTE — Telephone Encounter (Signed)
Oral swab drug screen positive for meth and cocaine and negative for her methylphenidate. She will be discharged from our clinic. The phone number listed is not her number so No message left. A discharge letter is being mailed certified mail. She does not have myChart. Information for the [place] enclosed with discharge letter.

## 2023-01-16 ENCOUNTER — Encounter: Payer: Self-pay | Admitting: Registered Nurse

## 2023-03-06 ENCOUNTER — Ambulatory Visit: Payer: Medicaid Other | Admitting: Physical Medicine & Rehabilitation

## 2023-05-08 ENCOUNTER — Ambulatory Visit: Payer: Medicaid Other | Admitting: Physical Medicine & Rehabilitation

## 2024-01-08 ENCOUNTER — Ambulatory Visit
Admission: EM | Admit: 2024-01-08 | Discharge: 2024-01-08 | Disposition: A | Discharge status: Home / Self Care | Attending: Emergency Medicine | Admitting: Emergency Medicine

## 2024-01-08 DIAGNOSIS — K047 Periapical abscess without sinus: Secondary | ICD-10-CM | POA: Diagnosis not present

## 2024-01-08 DIAGNOSIS — F17200 Nicotine dependence, unspecified, uncomplicated: Secondary | ICD-10-CM

## 2024-01-08 DIAGNOSIS — K0889 Other specified disorders of teeth and supporting structures: Secondary | ICD-10-CM | POA: Diagnosis not present

## 2024-01-08 MED ORDER — AMOXICILLIN-POT CLAVULANATE 875-125 MG PO TABS: 1.0000 | ORAL_TABLET | Freq: Two times a day (BID) | ORAL | 0 refills | Status: AC

## 2024-01-08 MED ORDER — CHLORHEXIDINE GLUCONATE 0.12 % MT SOLN: 5.0000 mL | Freq: Two times a day (BID) | OROMUCOSAL | 0 refills | Status: AC

## 2024-01-08 NOTE — Discharge Instructions (Addendum)
 Please follow up with your dental provider, take antibiotic as directed(augmentin), use peridex as prescribed. Go to Er for new or worsening issues(unable to take fluids,unable to keep medication down, worsening pain/swelling). Avoid chewing gum, hard candy, crunching ice as it makes issues worse,stop smoking. May use oragel as label directed, tylenol for pain as label directed

## 2024-01-08 NOTE — ED Provider Notes (Signed)
 [facility]    CSN: [id number] Arrival date & time: 01/08/24  1806      History   Chief Complaint Chief Complaint  Patient presents with   Dental Pain    HPI [name] is a 43 y.o. female.   43 year old female, [name], presents to urgent care for evaluation of right sided lower dental pain x 2 days, facial swelling.   Patient smokes half pack a day, states her family member "used to be her dentist but they retired so haven't been to the dentist in a while".  The history is provided by the patient. No language interpreter was used.    Past Medical History:  Diagnosis Date   Intracranial injury of other and unspecified nature, without mention of open intracranial wound, unspecified state of consciousness    Personality change due to conditions classified elsewhere    Spastic hemiplegia affecting nondominant side [facility])     Patient Active Problem List   Diagnosis Date Noted   Dental abscess 01/08/2024   Pain, dental 01/08/2024   Smoker 01/08/2024   Cognitive deficit as late effect of traumatic brain injury (HCC) 06/28/2021   Encounter for therapeutic drug monitoring 11/25/2017   Foot callus 11/20/2016   Urinary frequency 10/17/2015   Traumatic brain injury (HCC) 01/21/2012   Spastic hemiplegia affecting left nondominant side (HCC) 01/21/2012   Rotator cuff syndrome of left shoulder 01/21/2012    Past Surgical History:  Procedure Laterality Date   shunt placed  in head  2011    OB History   No obstetric history on file.      Home Medications    Prior to Admission medications   Medication Sig Start Date End Date Taking? Authorizing Provider  amoxicillin-clavulanate (AUGMENTIN) 875-125 MG tablet Take 1 tablet by mouth every 12 (twelve) hours for 10 days. 01/08/24 01/18/24 Yes Nayquan Evinger, Para March, NP  chlorhexidine (PERIDEX) 0.12 % solution Use as directed 5 mLs in the mouth or throat 2 (two) times daily. 01/08/24  Yes Zyanne Schumm, Para March, NP   citalopram (CELEXA) 10 MG tablet TAKE 1 TABLET(10 MG) BY MOUTH DAILY 04/03/18   Ranelle Oyster, MD  fish oil-omega-3 fatty acids 1000 MG capsule Take 1 g by mouth daily.    [provider]  methylphenidate (RITALIN) 10 MG tablet Take 1 tablet (10 mg total) by mouth 2 (two) times daily with breakfast and lunch. At 0700 and 1200 daily 10/24/22   Ranelle Oyster, MD  methylphenidate (RITALIN) 10 MG tablet Take 1 tablet (10 mg total) by mouth 2 (two) times daily with breakfast and lunch. At 0700 and 1200 daily 11/23/22   Ranelle Oyster, MD  Multiple Vitamins-Minerals (MULTIVITAMIN WITH MINERALS) tablet Take 1 tablet by mouth daily.    [provider]  Protein POWD Take by mouth 3 (three) times daily.    [provider]  solifenacin (VESICARE) 5 MG tablet TAKE 1 TABLET(5 MG) BY MOUTH AT BEDTIME 07/10/18   Ranelle Oyster, MD  tiZANidine (ZANAFLEX) 2 MG tablet TAKE 1 TABLET(2 MG) BY MOUTH THREE TIMES DAILY 01/11/20   Ranelle Oyster, MD    Family History History reviewed. No pertinent family history.  Social History Social History   Tobacco Use   Smoking status: Former    Current packs/day: 0.00    Types: Cigarettes    Quit date: 09/25/2011    Years since quitting: 12.2   Smokeless tobacco: Never  Vaping Use   Vaping status: Never  Used  Substance Use Topics   Alcohol use: Not Currently   Drug use: Never     Allergies   Patient has no known allergies.   Review of Systems Review of Systems  Constitutional:  Negative for fever.  HENT:  Positive for dental problem and facial swelling.   All other systems reviewed and are negative.    Physical Exam Triage Vital Signs ED Triage Vitals [01/08/24 1821]  Encounter Vitals Group     BP (!) 163/108     Systolic BP Percentile      Diastolic BP Percentile      Pulse Rate 90     Resp 17     Temp 99.4 F (37.4 C)     Temp Source Oral     SpO2 97 %     Weight      Height      Head Circumference       Peak Flow      Pain Score 10     Pain Loc      Pain Education      Exclude from Growth Chart    No data found.  Updated Vital Signs BP (!) (P) 159/102 (BP Location: Left Arm)   Pulse 90   Temp 99.4 F (37.4 C) (Oral)   Resp 17   SpO2 97%   Visual Acuity Right Eye Distance:   Left Eye Distance:   Bilateral Distance:    Right Eye Near:   Left Eye Near:    Bilateral Near:     Physical Exam Vitals and nursing note reviewed.  Constitutional:      General: She is not in acute distress.    Appearance: She is well-developed and well-groomed.  HENT:     Head: Normocephalic and atraumatic.     Mouth/Throat:     Dentition: Abnormal dentition. Gingival swelling, dental caries and dental abscesses present.      Comments: No trismus Eyes:     Conjunctiva/sclera: Conjunctivae normal.  Cardiovascular:     Rate and Rhythm: Normal rate and regular rhythm.     Heart sounds: Normal heart sounds. No murmur heard. Pulmonary:     Effort: Pulmonary effort is normal. No respiratory distress.     Breath sounds: Normal breath sounds and air entry.  Abdominal:     Palpations: Abdomen is soft.     Tenderness: There is no abdominal tenderness.  Musculoskeletal:        General: No swelling.     Cervical back: Neck supple.  Skin:    General: Skin is warm and dry.     Capillary Refill: Capillary refill takes less than 2 seconds.  Neurological:     General: No focal deficit present.     Mental Status: She is alert and oriented to person, place, and time.     Cranial Nerves: No cranial nerve deficit.     Sensory: No sensory deficit.  Psychiatric:        Mood and Affect: Mood normal.        Behavior: Behavior is cooperative.      UC Treatments / Results  Labs (all labs ordered are listed, but only abnormal results are displayed) Labs Reviewed - No data to display  EKG   Radiology No results found.  Procedures Procedures (including critical care time)  Medications  Ordered in UC Medications - No data to display  Initial Impression / Assessment and Plan / UC Course  I have reviewed the triage  vital signs and the nursing notes.  Pertinent labs & imaging results that were available during my care of the patient were reviewed by me and considered in my medical decision making (see chart for details).    Discussed exam findings and plan of care with patient, will treat with Augmentin and Peridex, recommend following up with dental provider of her choice, strict go to ER precautions given(new or worsening symptoms: Increased swelling, pain, unable to open mouth, unable to keep medications down, etc.), patient verbalized understanding this provider  Ddx: Dental abscess, dental pain, smoker Final Clinical Impressions(s) / UC Diagnoses   Final diagnoses:  Dental abscess  Pain, dental  Smoker     Discharge Instructions      Please follow up with your dental provider, take antibiotic as directed(augmentin), use peridex as prescribed. Go to Er for new or worsening issues(unable to take fluids,unable to keep medication down, worsening pain/swelling). Avoid chewing gum, hard candy, crunching ice as it makes issues worse,stop smoking. May use oragel as label directed, tylenol for pain as label directed     ED Prescriptions     Medication Sig Dispense Auth. Provider   amoxicillin-clavulanate (AUGMENTIN) 875-125 MG tablet Take 1 tablet by mouth every 12 (twelve) hours for 10 days. 20 tablet Anette Barra, NP   chlorhexidine (PERIDEX) 0.12 % solution Use as directed 5 mLs in the mouth or throat 2 (two) times daily. 118 mL Socrates Cahoon, Para March, NP      PDMP not reviewed this encounter.   Clancy Gourd, NP 01/08/24 2039

## 2024-01-08 NOTE — ED Triage Notes (Signed)
 Patient has dental abscess on right side lower jaw-teeth.
